# Patient Record
Sex: Male | Born: 1990 | Race: White | Hispanic: No | Marital: Single | Smoking: Former smoker
Health system: Southern US, Community
[De-identification: ages and names within clinical notes are randomized; demographics above are authoritative.]

## PROBLEM LIST (undated history)

## (undated) ENCOUNTER — Encounter

## (undated) ENCOUNTER — Ambulatory Visit: Payer: BLUE CROSS/BLUE SHIELD

## (undated) ENCOUNTER — Ambulatory Visit: Payer: PRIVATE HEALTH INSURANCE | Attending: Internal Medicine | Primary: Internal Medicine

## (undated) ENCOUNTER — Encounter: Attending: Infectious Disease | Primary: Infectious Disease

## (undated) ENCOUNTER — Encounter
Attending: Student in an Organized Health Care Education/Training Program | Primary: Student in an Organized Health Care Education/Training Program

## (undated) ENCOUNTER — Encounter
Payer: PRIVATE HEALTH INSURANCE | Attending: Physical Medicine & Rehabilitation | Primary: Physical Medicine & Rehabilitation

## (undated) ENCOUNTER — Telehealth

## (undated) ENCOUNTER — Encounter: Attending: Clinical | Primary: Clinical

## (undated) ENCOUNTER — Ambulatory Visit

## (undated) ENCOUNTER — Ambulatory Visit: Payer: PRIVATE HEALTH INSURANCE

## (undated) ENCOUNTER — Ambulatory Visit: Attending: Orthopaedic Surgery | Primary: Orthopaedic Surgery

## (undated) ENCOUNTER — Encounter: Payer: PRIVATE HEALTH INSURANCE | Attending: Orthopaedic Surgery | Primary: Orthopaedic Surgery

## (undated) ENCOUNTER — Encounter
Payer: PRIVATE HEALTH INSURANCE | Attending: Rehabilitative and Restorative Service Providers" | Primary: Rehabilitative and Restorative Service Providers"

## (undated) ENCOUNTER — Encounter: Payer: PRIVATE HEALTH INSURANCE | Attending: Ophthalmology | Primary: Ophthalmology

## (undated) ENCOUNTER — Encounter: Attending: Pediatrics | Primary: Pediatrics

## (undated) ENCOUNTER — Encounter: Payer: PRIVATE HEALTH INSURANCE | Attending: Otolaryngology | Primary: Otolaryngology

## (undated) ENCOUNTER — Encounter: Attending: Sports Medicine | Primary: Sports Medicine

## (undated) DIAGNOSIS — L409 Psoriasis, unspecified: Secondary | ICD-10-CM

## (undated) HISTORY — PX: TONSILLECTOMY: SUR1361

---
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
## 2016-08-17 ENCOUNTER — Other Ambulatory Visit: Payer: Self-pay | Admitting: Otolaryngology

## 2016-08-17 DIAGNOSIS — H9210 Otorrhea, unspecified ear: Secondary | ICD-10-CM

## 2016-08-17 DIAGNOSIS — H900 Conductive hearing loss, bilateral: Secondary | ICD-10-CM

## 2016-08-24 ENCOUNTER — Ambulatory Visit: Payer: Self-pay

## 2016-08-25 ENCOUNTER — Ambulatory Visit
Admission: RE | Admit: 2016-08-25 | Discharge: 2016-08-25 | Disposition: A | Discharge status: Home / Self Care | Payer: Managed Care, Other (non HMO) | Source: Ambulatory Visit | Attending: Otolaryngology | Admitting: Otolaryngology

## 2016-08-25 DIAGNOSIS — H9211 Otorrhea, right ear: Secondary | ICD-10-CM | POA: Diagnosis not present

## 2016-08-25 DIAGNOSIS — H900 Conductive hearing loss, bilateral: Secondary | ICD-10-CM | POA: Diagnosis present

## 2016-08-25 DIAGNOSIS — H9210 Otorrhea, unspecified ear: Secondary | ICD-10-CM

## 2016-09-07 ENCOUNTER — Encounter
Admission: RE | Admit: 2016-09-07 | Discharge: 2016-09-07 | Disposition: A | Discharge status: Home / Self Care | Payer: Managed Care, Other (non HMO) | Source: Ambulatory Visit | Attending: Otolaryngology | Admitting: Otolaryngology

## 2016-09-07 HISTORY — DX: Psoriasis, unspecified: L40.9

## 2016-09-07 NOTE — Patient Instructions (Signed)
  Your procedure is scheduled on: 09-15-16  Report to Same Day Surgery 2nd floor medical mall [facility](Medical Mall Entrance-take elevator on left to 2nd floor.  Check in with surgery information desk.) To find out your arrival time please call [phone] between 1PM - 3PM on 09-14-16  Remember: Instructions that are not followed completely may result in serious medical risk, up to and including death, or upon the discretion of your surgeon and anesthesiologist your surgery may need to be rescheduled.    _x___ 1. Do not eat food or drink liquids after midnight. No gum chewing or   hard candies.     __x__ 2. No Alcohol for 24 hours before or after surgery.   __x__3. No Smoking for 24 prior to surgery.   ____  4. Bring all medications with you on the day of surgery if instructed.    __x__ 5. Notify your doctor if there is any change in your medical condition     (cold, fever, infections).     Do not wear jewelry, make-up, hairpins, clips or nail polish.  Do not wear lotions, powders, or perfumes. You may wear deodorant.  Do not shave 48 hours prior to surgery. Men may shave face and neck.  Do not bring valuables to the hospital.    [facility] is not responsible for any belongings or valuables.               Contacts, dentures or bridgework may not be worn into surgery.  Leave your suitcase in the car. After surgery it may be brought to your room.  For patients admitted to the hospital, discharge time is determined by your                       treatment team.   Patients discharged the day of surgery will not be allowed to drive home.  You will need someone to drive you home and stay with you the night of your procedure.    Please read over the following fact sheets that you were given:   [facility] Preparing for [facility] and or MRSA Information   ____ Take anti-hypertensive (unless it includes a diuretic), cardiac, seizure, asthma,     anti-reflux and psychiatric medicines. These  include:  1. NONE  2.  3.  4.  5.  6.  ____Fleets enema or Magnesium Citrate as directed.   ____ Use CHG Soap or sage wipes as directed on instruction sheet   ____ Use inhalers on the day of surgery and bring to hospital day of surgery  ____ Stop Metformin and Janumet 2 days prior to surgery.    ____ Take 1/2 of usual insulin dose the night before surgery and none on the morning     surgery.   ____ Follow recommendations from Cardiologist, Pulmonologist or PCP regarding          stopping Aspirin, Coumadin, Pllavix ,Eliquis, Effient, or Pradaxa, and Pletal.  X____Stop Anti-inflammatories such as Advil, Aleve, Ibuprofen, Motrin, Naproxen, Naprosyn, Goodies powders or aspirin products NOW-OK to take Tylenol    ____ Stop supplements until after surgery.     ____ Bring C-Pap to the hospital.

## 2016-09-15 ENCOUNTER — Encounter: Admission: RE | Payer: Self-pay | Source: Ambulatory Visit

## 2016-09-15 ENCOUNTER — Ambulatory Visit
Admission: RE | Admit: 2016-09-15 | Payer: Managed Care, Other (non HMO) | Source: Ambulatory Visit | Admitting: Otolaryngology

## 2016-09-15 SURGERY — TYMPANOPLASTY, WITH MASTOIDECTOMY
Anesthesia: Choice | Laterality: Right

## 2016-09-15 MED ORDER — OFLOXACIN 0.3 % OP SOLN
OPHTHALMIC | Status: AC
  Filled 2016-09-15: qty 5

## 2016-09-15 MED ORDER — LIDOCAINE-EPINEPHRINE (PF) 1 %-1:200000 IJ SOLN
INTRAMUSCULAR | Status: AC
  Filled 2016-09-15: qty 30

## 2016-09-15 MED ORDER — BACITRACIN ZINC 500 UNIT/GM EX OINT
TOPICAL_OINTMENT | CUTANEOUS | Status: AC
  Filled 2016-09-15: qty 0.9

## 2016-09-15 MED ORDER — FAMOTIDINE 20 MG PO TABS
ORAL_TABLET | ORAL | Status: AC
  Filled 2016-09-15: qty 1

## 2016-09-15 MED ORDER — EPINEPHRINE PF 1 MG/ML IJ SOLN
INTRAMUSCULAR | Status: AC
  Filled 2016-09-15: qty 1

## 2016-09-15 MED ORDER — GELATIN ABSORBABLE 12-7 MM EX MISC
CUTANEOUS | Status: AC
  Filled 2016-09-15: qty 1

## 2017-03-07 ENCOUNTER — Ambulatory Visit
Admission: RE | Admit: 2017-03-07 | Discharge: 2017-03-07 | Disposition: A | Discharge status: Home / Self Care | Payer: PRIVATE HEALTH INSURANCE | Attending: Otolaryngology | Admitting: Otolaryngology

## 2017-03-07 ENCOUNTER — Ambulatory Visit
Admission: RE | Admit: 2017-03-07 | Discharge: 2017-03-07 | Disposition: A | Discharge status: Home / Self Care | Payer: PRIVATE HEALTH INSURANCE

## 2017-03-07 ENCOUNTER — Ambulatory Visit
Admission: RE | Admit: 2017-03-07 | Discharge: 2017-03-07 | Disposition: A | Discharge status: Home / Self Care | Payer: MEDICAID | Attending: Audiologist | Admitting: Audiologist

## 2017-03-07 DIAGNOSIS — H9011 Conductive hearing loss, unilateral, right ear, with unrestricted hearing on the contralateral side: Principal | ICD-10-CM

## 2017-03-07 DIAGNOSIS — R42 Dizziness and giddiness: Secondary | ICD-10-CM

## 2017-03-07 DIAGNOSIS — H7101 Cholesteatoma of attic, right ear: Principal | ICD-10-CM

## 2017-03-14 ENCOUNTER — Ambulatory Visit
Admission: RE | Admit: 2017-03-14 | Discharge: 2017-03-14 | Disposition: A | Discharge status: Home / Self Care | Payer: PRIVATE HEALTH INSURANCE

## 2017-03-14 DIAGNOSIS — H7101 Cholesteatoma of attic, right ear: Principal | ICD-10-CM

## 2017-03-17 ENCOUNTER — Ambulatory Visit
Admission: RE | Admit: 2017-03-17 | Discharge: 2017-03-17 | Disposition: A | Discharge status: Home / Self Care | Payer: PRIVATE HEALTH INSURANCE | Attending: Otolaryngology | Admitting: Otolaryngology

## 2017-03-17 ENCOUNTER — Ambulatory Visit
Admission: RE | Admit: 2017-03-17 | Discharge: 2017-03-17 | Disposition: A | Discharge status: Home / Self Care | Payer: PRIVATE HEALTH INSURANCE | Attending: Student in an Organized Health Care Education/Training Program | Admitting: Student in an Organized Health Care Education/Training Program

## 2017-03-17 DIAGNOSIS — Z01818 Encounter for other preprocedural examination: Principal | ICD-10-CM

## 2017-03-17 DIAGNOSIS — H7190 Unspecified cholesteatoma, unspecified ear: Principal | ICD-10-CM

## 2017-04-01 ENCOUNTER — Ambulatory Visit
Admission: RE | Admit: 2017-04-01 | Discharge: 2017-04-01 | Disposition: A | Discharge status: Home / Self Care | Payer: PRIVATE HEALTH INSURANCE | Attending: Certified Registered" | Admitting: Certified Registered"

## 2017-04-01 ENCOUNTER — Ambulatory Visit
Admission: RE | Admit: 2017-04-01 | Discharge: 2017-04-01 | Disposition: A | Discharge status: Home / Self Care | Payer: PRIVATE HEALTH INSURANCE | Attending: Otolaryngology | Admitting: Otolaryngology

## 2017-04-01 ENCOUNTER — Ambulatory Visit
Admission: RE | Admit: 2017-04-01 | Discharge: 2017-04-01 | Disposition: A | Discharge status: Home / Self Care | Payer: MEDICAID | Attending: Certified Registered" | Admitting: Certified Registered"

## 2017-04-01 DIAGNOSIS — H9191 Unspecified hearing loss, right ear: Principal | ICD-10-CM

## 2017-04-01 MED ORDER — CIPROFLOXACIN 0.3 %-DEXAMETHASONE 0.1 % EAR DROPS,SUSPENSION
Freq: Two times a day (BID) | OTIC | 0 refills | 0.00000 days | Status: CP
Start: 2017-04-01 — End: 2017-05-06

## 2017-04-01 MED ORDER — OXYCODONE 5 MG TABLET
ORAL_TABLET | ORAL | 0 refills | 0 days | Status: CP | PRN
Start: 2017-04-01 — End: 2017-04-06

## 2017-04-01 MED ORDER — AMOXICILLIN 875 MG-POTASSIUM CLAVULANATE 125 MG TABLET
ORAL_TABLET | Freq: Two times a day (BID) | ORAL | 0 refills | 0 days | Status: CP
Start: 2017-04-01 — End: 2017-04-08

## 2017-05-05 ENCOUNTER — Ambulatory Visit: Admit: 2017-05-05 | Discharge: 2017-05-06 | Attending: Otolaryngology | Primary: Otolaryngology

## 2017-05-05 DIAGNOSIS — H9193 Unspecified hearing loss, bilateral: Principal | ICD-10-CM

## 2017-05-05 DIAGNOSIS — H7190 Unspecified cholesteatoma, unspecified ear: Secondary | ICD-10-CM

## 2017-07-21 ENCOUNTER — Encounter
Admit: 2017-07-21 | Discharge: 2017-07-21 | Payer: PRIVATE HEALTH INSURANCE | Attending: Otolaryngology | Primary: Otolaryngology

## 2017-07-21 ENCOUNTER — Encounter
Admit: 2017-07-21 | Discharge: 2017-07-21 | Payer: PRIVATE HEALTH INSURANCE | Attending: Audiologist | Primary: Audiologist

## 2017-07-21 DIAGNOSIS — H7191 Unspecified cholesteatoma, right ear: Secondary | ICD-10-CM

## 2017-07-21 DIAGNOSIS — H90A11 Conductive hearing loss, unilateral, right ear with restricted hearing on the contralateral side: Secondary | ICD-10-CM

## 2017-07-21 DIAGNOSIS — H9193 Unspecified hearing loss, bilateral: Principal | ICD-10-CM

## 2017-10-04 ENCOUNTER — Encounter: Admit: 2017-10-04 | Discharge: 2017-10-05 | Payer: PRIVATE HEALTH INSURANCE

## 2017-10-04 DIAGNOSIS — M545 Low back pain: Secondary | ICD-10-CM

## 2017-10-04 DIAGNOSIS — B360 Pityriasis versicolor: Principal | ICD-10-CM

## 2017-10-04 DIAGNOSIS — Z0000 Encounter for general adult medical examination without abnormal findings: Secondary | ICD-10-CM

## 2017-10-04 DIAGNOSIS — L980 Pyogenic granuloma: Secondary | ICD-10-CM

## 2017-10-04 MED ORDER — KETOCONAZOLE 2 % TOPICAL CREAM
Freq: Two times a day (BID) | TOPICAL | 3 refills | 0.00000 days | Status: CP
Start: 2017-10-04 — End: 2017-10-18

## 2017-10-12 ENCOUNTER — Encounter
Admit: 2017-10-12 | Discharge: 2017-10-12 | Payer: PRIVATE HEALTH INSURANCE | Attending: Otolaryngology | Primary: Otolaryngology

## 2017-10-12 ENCOUNTER — Ambulatory Visit
Admit: 2017-10-12 | Discharge: 2017-10-12 | Payer: PRIVATE HEALTH INSURANCE | Attending: Audiologist | Primary: Audiologist

## 2017-10-12 ENCOUNTER — Encounter: Admit: 2017-10-12 | Discharge: 2017-10-12 | Payer: PRIVATE HEALTH INSURANCE

## 2017-10-12 DIAGNOSIS — H7191 Unspecified cholesteatoma, right ear: Secondary | ICD-10-CM

## 2017-10-12 DIAGNOSIS — H9193 Unspecified hearing loss, bilateral: Principal | ICD-10-CM

## 2017-11-21 ENCOUNTER — Encounter: Admit: 2017-11-21 | Discharge: 2017-11-22 | Payer: PRIVATE HEALTH INSURANCE

## 2017-11-21 DIAGNOSIS — M7130 Other bursal cyst, unspecified site: Secondary | ICD-10-CM

## 2017-11-21 DIAGNOSIS — L980 Pyogenic granuloma: Principal | ICD-10-CM

## 2017-12-26 ENCOUNTER — Encounter
Admit: 2017-12-26 | Discharge: 2017-12-26 | Payer: PRIVATE HEALTH INSURANCE | Attending: Orthopaedic Surgery | Primary: Orthopaedic Surgery

## 2017-12-26 ENCOUNTER — Encounter: Admit: 2017-12-26 | Discharge: 2017-12-26 | Payer: PRIVATE HEALTH INSURANCE

## 2017-12-26 DIAGNOSIS — M7130 Other bursal cyst, unspecified site: Principal | ICD-10-CM

## 2018-01-10 ENCOUNTER — Encounter: Admit: 2018-01-10 | Discharge: 2018-01-11 | Payer: PRIVATE HEALTH INSURANCE | Attending: Family | Primary: Family

## 2018-01-10 DIAGNOSIS — M545 Low back pain: Principal | ICD-10-CM

## 2018-01-10 MED ORDER — PREDNISONE 10 MG TABLETS IN A DOSE PACK
PACK | 0 refills | 0 days | Status: CP
Start: 2018-01-10 — End: 2018-03-06

## 2018-01-11 ENCOUNTER — Encounter
Admit: 2018-01-11 | Discharge: 2018-01-12 | Payer: PRIVATE HEALTH INSURANCE | Attending: Rehabilitative and Restorative Service Providers" | Primary: Rehabilitative and Restorative Service Providers"

## 2018-01-11 DIAGNOSIS — M545 Low back pain: Principal | ICD-10-CM

## 2018-01-26 DIAGNOSIS — R2231 Localized swelling, mass and lump, right upper limb: Principal | ICD-10-CM

## 2018-01-27 ENCOUNTER — Encounter: Admit: 2018-01-27 | Discharge: 2018-01-27 | Payer: PRIVATE HEALTH INSURANCE

## 2018-01-27 DIAGNOSIS — R2231 Localized swelling, mass and lump, right upper limb: Principal | ICD-10-CM

## 2018-01-27 MED ORDER — HYDROCODONE 5 MG-ACETAMINOPHEN 325 MG TABLET T-HOME
ORAL_TABLET | Freq: Four times a day (QID) | ORAL | 0 refills | 0.00000 days | Status: CP | PRN
Start: 2018-01-27 — End: 2018-01-30

## 2018-02-09 ENCOUNTER — Encounter
Admit: 2018-02-09 | Discharge: 2018-02-10 | Payer: PRIVATE HEALTH INSURANCE | Attending: Orthopaedic Surgery | Primary: Orthopaedic Surgery

## 2018-02-09 DIAGNOSIS — R2231 Localized swelling, mass and lump, right upper limb: Principal | ICD-10-CM

## 2018-03-06 ENCOUNTER — Ambulatory Visit
Admit: 2018-03-06 | Discharge: 2018-03-07 | Payer: PRIVATE HEALTH INSURANCE | Attending: Otolaryngology | Primary: Otolaryngology

## 2018-03-06 DIAGNOSIS — H7191 Unspecified cholesteatoma, right ear: Principal | ICD-10-CM

## 2018-03-20 ENCOUNTER — Ambulatory Visit
Admit: 2018-03-20 | Discharge: 2018-03-21 | Payer: PRIVATE HEALTH INSURANCE | Attending: Community Health | Primary: Community Health

## 2018-03-20 DIAGNOSIS — J0100 Acute maxillary sinusitis, unspecified: Principal | ICD-10-CM

## 2018-03-20 IMAGING — CT CT TEMPORAL BONES W/O CM
1 series · 15 of 26 positions shown, 19 images · non-contrast
Comparison: None.

CLINICAL DATA: Conductive hearing loss.  Otorrhea.

EXAM:
CT TEMPORAL BONES WITHOUT CONTRAST
TECHNIQUE: Axial and coronal plane CT imaging of the petrous temporal bones was
performed with thin-collimation image reconstruction. No intravenous
contrast was administered. Multiplanar CT image reconstructions were
also generated.

[Series 3: ax soft · axial · 0.33mm/px · z∈[-52,-6]mm · 15 of 26 slices shown, 19 images]
[im 2/26  brain]
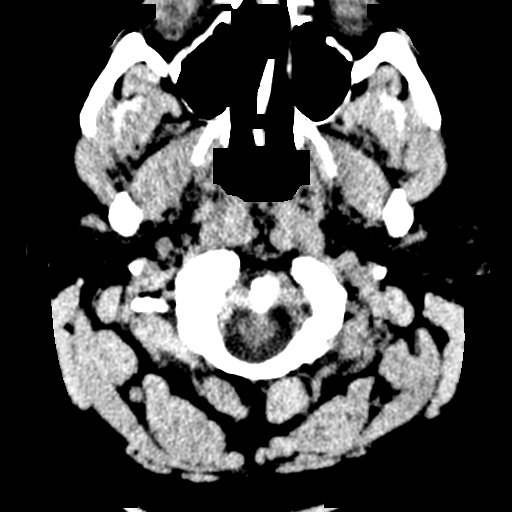
[im 2/26  bone]
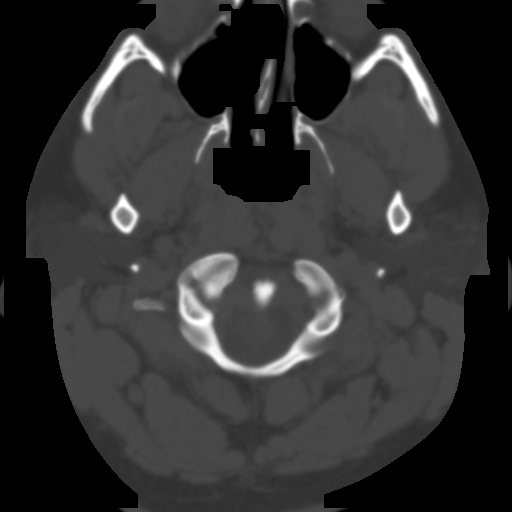
[im 4/26  bone]
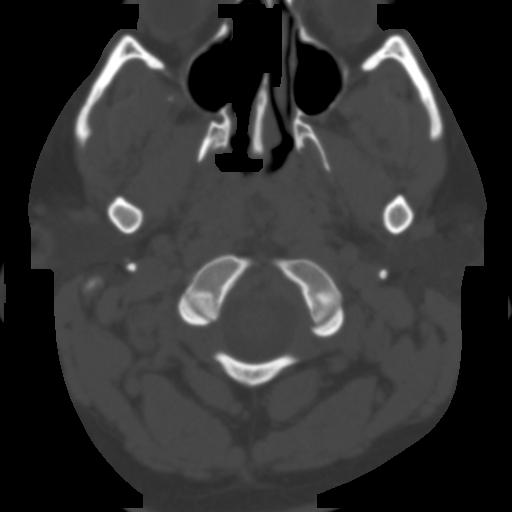
[im 6/26  bone]
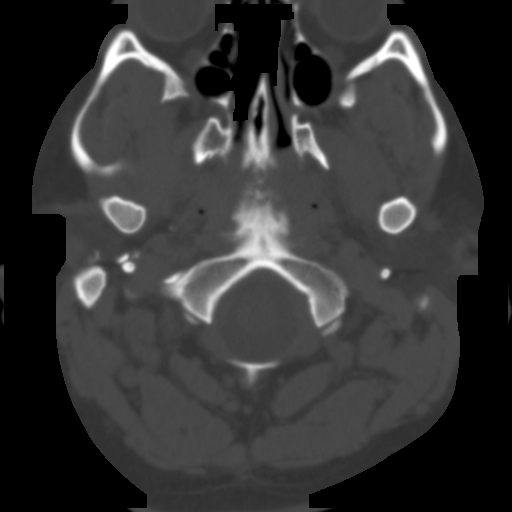
[im 7/26  bone]
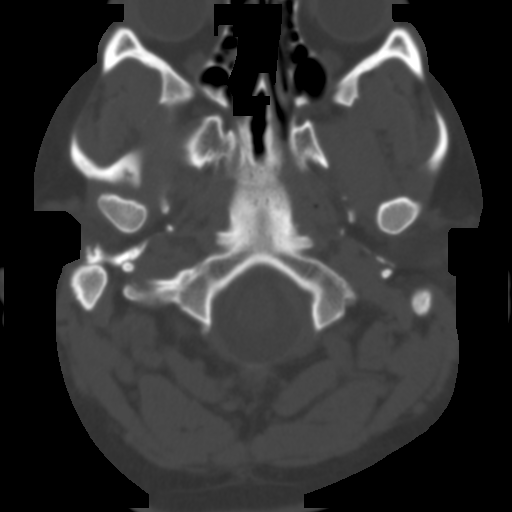
[im 9/26  brain]
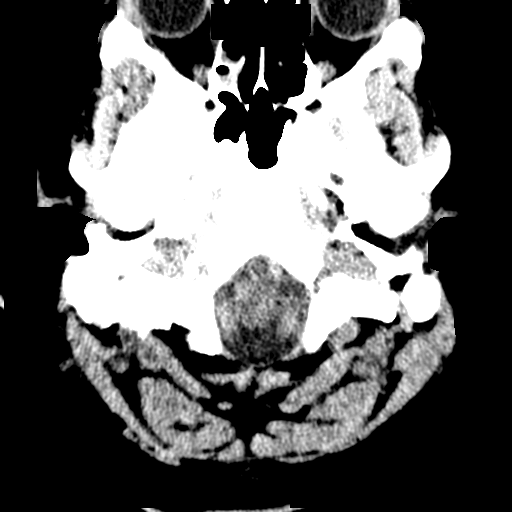
[im 9/26  bone]
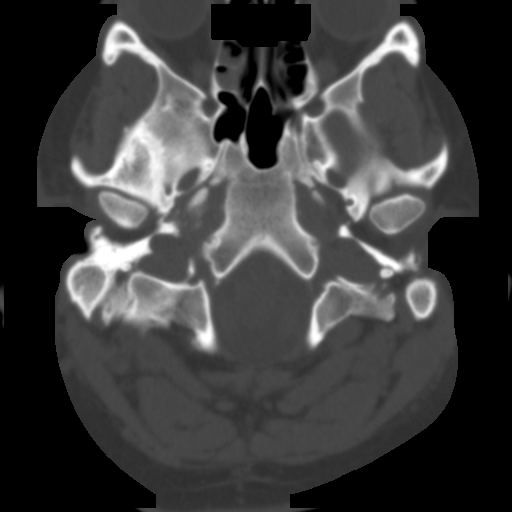
[im 10/26  bone]
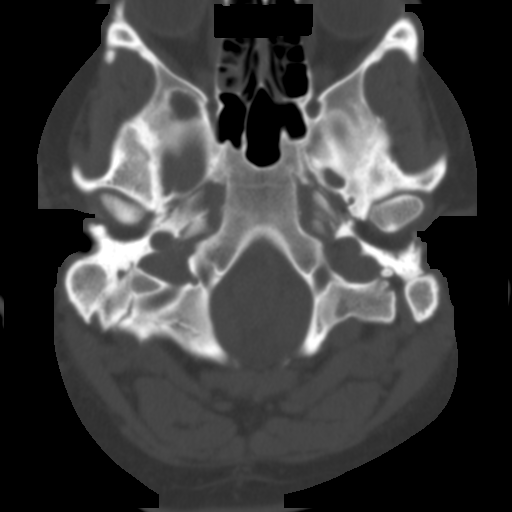
[im 12/26  bone]
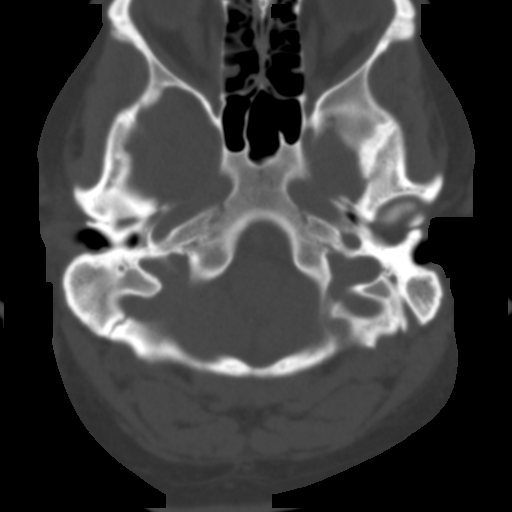
[im 14/26  bone]
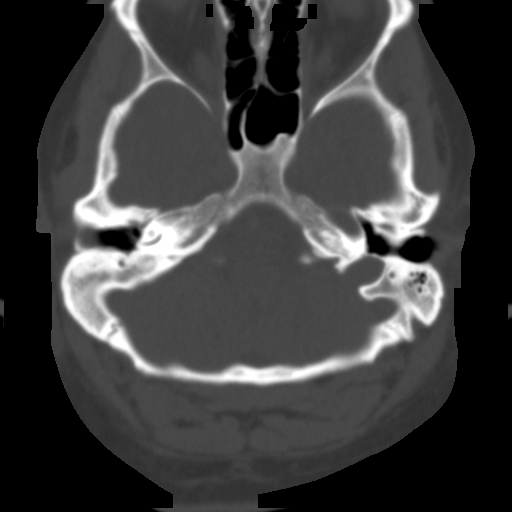
[im 15/26  brain]
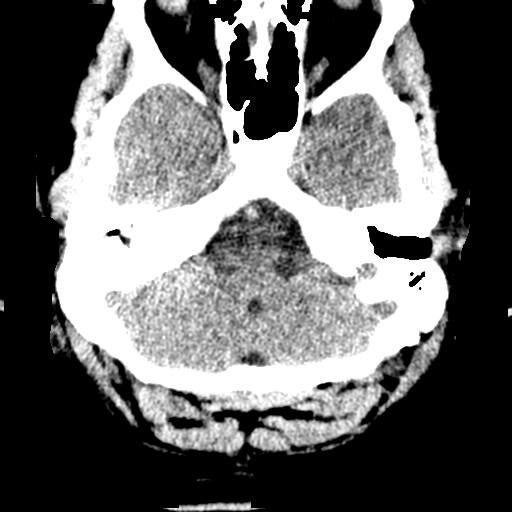
[im 15/26  bone]
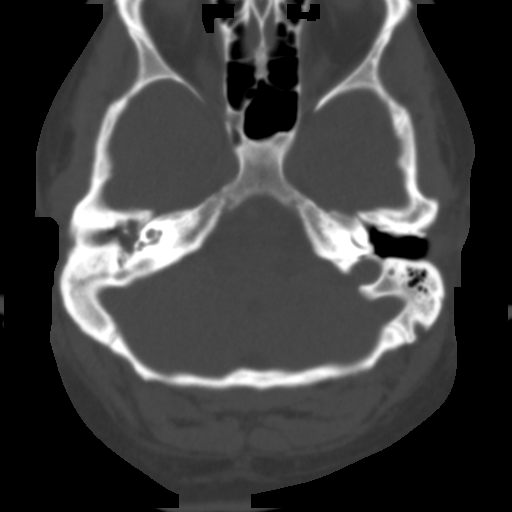
[im 17/26  bone]
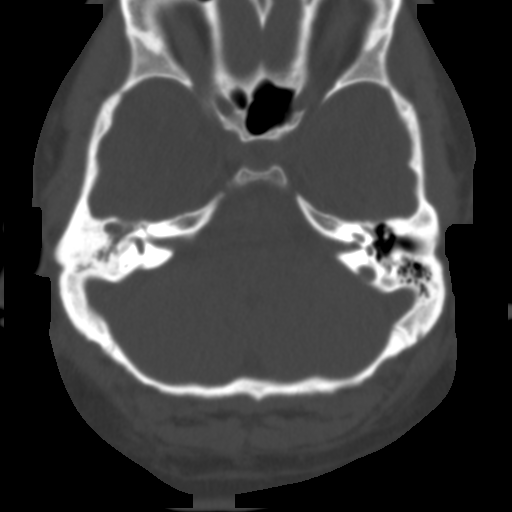
[im 18/26  bone]
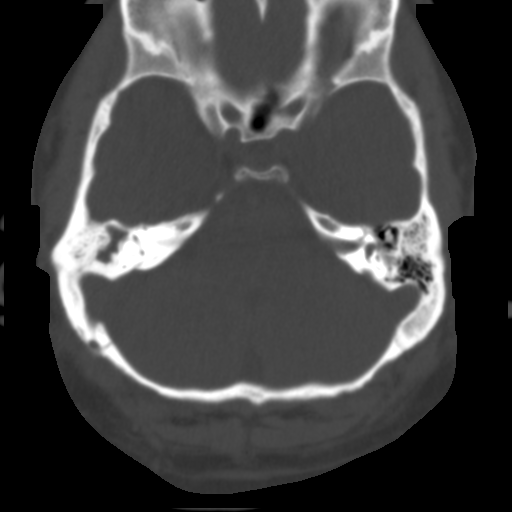
[im 20/26  bone]
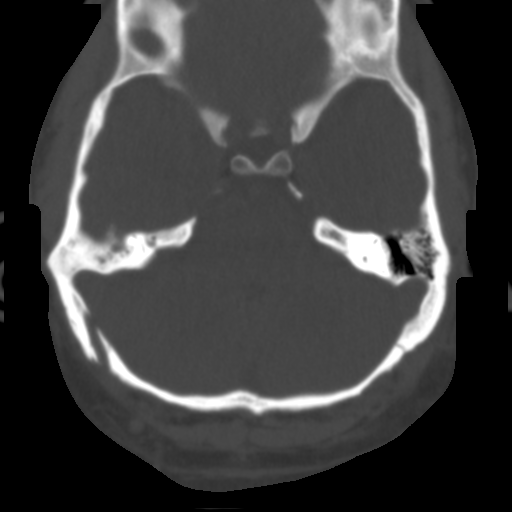
[im 22/26  brain]
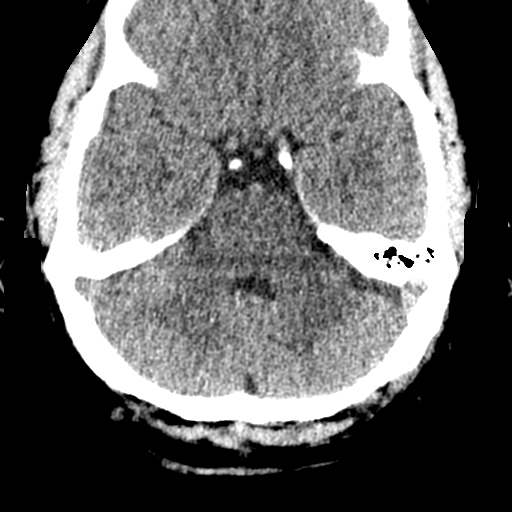
[im 22/26  bone]
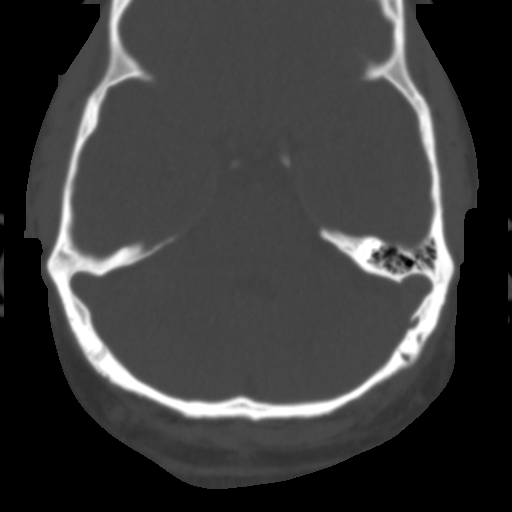
[im 23/26  bone]
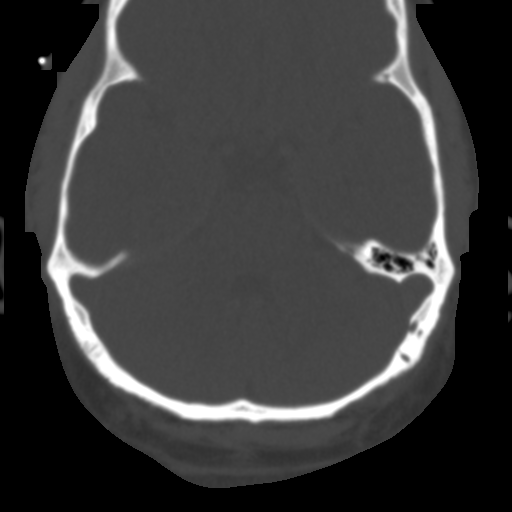
[im 25/26  bone]
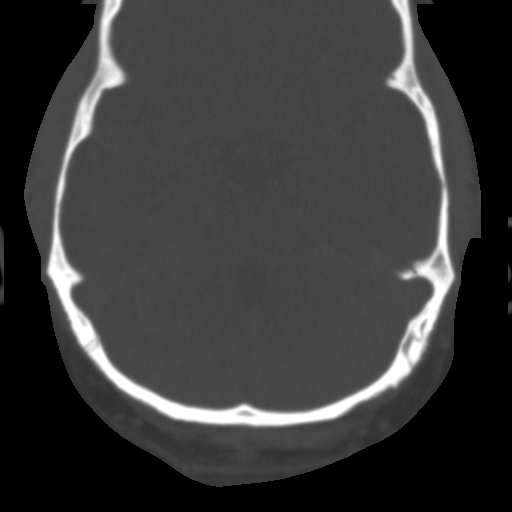

[15 of 26 positions shown; findings below may reference images not displayed]

FINDINGS: RIGHT: There is mild-to-moderate smooth soft tissue thickening
predominantly along the superior aspect of the external auditory
canal. The tympanic membrane is retracted. There is near complete
soft tissue opacification of the middle ear cavity with partial
erosion of the malleus and incus. The stapes is not well seen. There
may be slight erosion of the scutum. A complete bony covering is not
clearly identified surrounding the tympanic portion of the facial
nerve, with middle ear soft tissue extending to this region. There
is osseous thinning versus dehiscence of the tegmen tympani (series
6, image 72). The mastoid antrum is completely opacified. The
mastoid air cells overall appear sclerotic/underdeveloped. The
internal auditory canal, cochlea, vestibule, and semicircular canals
are unremarkable.

LEFT: The external auditory canal and tympanic membrane are
unremarkable. The ossicles appear intact. The tympanic cavity is
clear. The internal auditory canal, cochlea, vestibule, and
semicircular canals are unremarkable. The mastoid air cells are
clear.

There is mild right ethmoid air cell mucosal thickening. The
visualized portion of the brain is unremarkable. Visualized orbits
are unremarkable.
IMPRESSION: 1. Soft tissue throughout the right middle ear and mastoid antrum
with partial erosion of the ossicles, concerning for cholesteatoma.
Osseous thinning versus dehiscence of the tegmen tympani.
2. Unremarkable appearance of the left temporal bone.

## 2018-03-20 MED ORDER — AMOXICILLIN 875 MG-POTASSIUM CLAVULANATE 125 MG TABLET
ORAL_TABLET | Freq: Two times a day (BID) | ORAL | 0 refills | 0.00000 days | Status: CP
Start: 2018-03-20 — End: 2018-03-27

## 2018-04-04 ENCOUNTER — Encounter: Admit: 2018-04-04 | Discharge: 2018-04-05 | Payer: PRIVATE HEALTH INSURANCE

## 2018-04-04 DIAGNOSIS — J208 Acute bronchitis due to other specified organisms: Principal | ICD-10-CM

## 2018-11-27 ENCOUNTER — Encounter
Admit: 2018-11-27 | Discharge: 2018-11-28 | Payer: PRIVATE HEALTH INSURANCE | Attending: Otolaryngology | Primary: Otolaryngology

## 2018-11-27 DIAGNOSIS — H7292 Unspecified perforation of tympanic membrane, left ear: Principal | ICD-10-CM

## 2018-11-27 DIAGNOSIS — H7191 Unspecified cholesteatoma, right ear: Secondary | ICD-10-CM

## 2018-11-27 MED ORDER — A-C-H-S OTIC INSUFFLATE CAPS
ORAL_CAPSULE | OTIC | 0 refills | 0.00000 days | Status: CP
Start: 2018-11-27 — End: 2019-02-08

## 2018-11-27 MED FILL — SULFANILAMIDE (BULK) POWDER, CHLORAMPHENICOL (BULK) 100 % POWDER, A-C-H-S OTIC INSUFFLATE CAPS, HYDROCORTISONE (BULK) POWDER, CORN STARCH (BULK) POWDER: 73 days supply | Qty: 3 | Fill #0 | Status: AC

## 2018-11-27 MED FILL — SULFANILAMIDE (BULK) POWDER, CHLORAMPHENICOL (BULK) 100 % POWDER, A-C-H-S OTIC INSUFFLATE CAPS, HYDROCORTISONE (BULK) POWDER, CORN STARCH (BULK) POWDER: OTIC | 73 days supply | Qty: 3 | Fill #0

## 2019-04-23 ENCOUNTER — Encounter: Admit: 2019-04-23 | Discharge: 2019-04-24 | Payer: PRIVATE HEALTH INSURANCE

## 2019-04-23 DIAGNOSIS — Z23 Encounter for immunization: Principal | ICD-10-CM

## 2019-04-23 DIAGNOSIS — Z0000 Encounter for general adult medical examination without abnormal findings: Principal | ICD-10-CM

## 2019-05-24 ENCOUNTER — Encounter: Admit: 2019-05-24 | Discharge: 2019-05-25 | Payer: PRIVATE HEALTH INSURANCE

## 2019-06-01 ENCOUNTER — Encounter: Admit: 2019-06-01 | Discharge: 2019-06-01 | Payer: PRIVATE HEALTH INSURANCE

## 2019-06-01 DIAGNOSIS — H04123 Dry eye syndrome of bilateral lacrimal glands: Principal | ICD-10-CM

## 2019-06-22 ENCOUNTER — Ambulatory Visit: Admit: 2019-06-22 | Discharge: 2019-06-23 | Payer: PRIVATE HEALTH INSURANCE

## 2019-07-30 DIAGNOSIS — G4730 Sleep apnea, unspecified: Principal | ICD-10-CM

## 2019-11-08 ENCOUNTER — Encounter: Admit: 2019-11-08 | Discharge: 2019-11-08 | Payer: PRIVATE HEALTH INSURANCE

## 2019-11-08 DIAGNOSIS — H9193 Unspecified hearing loss, bilateral: Principal | ICD-10-CM

## 2019-12-05 ENCOUNTER — Encounter: Admit: 2019-12-05 | Discharge: 2019-12-07 | Payer: PRIVATE HEALTH INSURANCE

## 2019-12-12 DIAGNOSIS — G4739 Other sleep apnea: Principal | ICD-10-CM

## 2019-12-17 ENCOUNTER — Encounter: Admit: 2019-12-17 | Discharge: 2019-12-18 | Payer: PRIVATE HEALTH INSURANCE

## 2019-12-20 ENCOUNTER — Encounter: Admit: 2019-12-20 | Discharge: 2019-12-21 | Payer: PRIVATE HEALTH INSURANCE

## 2020-02-26 ENCOUNTER — Ambulatory Visit: Admit: 2020-02-26 | Discharge: 2020-02-27

## 2020-02-26 MED ORDER — LORAZEPAM 1 MG TABLET
ORAL_TABLET | 0 refills | 0 days | Status: CP
Start: 2020-02-26 — End: ?

## 2020-03-03 ENCOUNTER — Ambulatory Visit
Admit: 2020-03-03 | Discharge: 2020-03-04 | Payer: PRIVATE HEALTH INSURANCE | Attending: Physician Assistant | Primary: Physician Assistant

## 2020-03-03 DIAGNOSIS — Z20822 Contact with and (suspected) exposure to covid-19: Principal | ICD-10-CM

## 2020-03-12 ENCOUNTER — Ambulatory Visit
Admit: 2020-03-12 | Discharge: 2020-03-13 | Payer: PRIVATE HEALTH INSURANCE | Attending: Physician Assistant | Primary: Physician Assistant

## 2020-03-12 DIAGNOSIS — Z20828 Contact with and (suspected) exposure to other viral communicable diseases: Principal | ICD-10-CM

## 2020-03-12 DIAGNOSIS — Z20822 Contact with and (suspected) exposure to covid-19: Principal | ICD-10-CM

## 2020-03-13 ENCOUNTER — Ambulatory Visit
Admit: 2020-03-13 | Discharge: 2020-03-14 | Payer: PRIVATE HEALTH INSURANCE | Attending: Adult Health | Primary: Adult Health

## 2020-03-13 DIAGNOSIS — J069 Acute upper respiratory infection, unspecified: Principal | ICD-10-CM

## 2020-03-13 DIAGNOSIS — H6691 Otitis media, unspecified, right ear: Principal | ICD-10-CM

## 2020-03-13 DIAGNOSIS — H6091 Unspecified otitis externa, right ear: Principal | ICD-10-CM

## 2020-03-13 MED ORDER — AMOXICILLIN 875 MG-POTASSIUM CLAVULANATE 125 MG TABLET
ORAL_TABLET | Freq: Two times a day (BID) | ORAL | 0 refills | 7 days | Status: CP
Start: 2020-03-13 — End: 2020-03-20

## 2020-03-15 ENCOUNTER — Ambulatory Visit
Admit: 2020-03-15 | Discharge: 2020-03-16 | Payer: PRIVATE HEALTH INSURANCE | Attending: Physician Assistant | Primary: Physician Assistant

## 2020-03-15 DIAGNOSIS — Z20828 Contact with and (suspected) exposure to other viral communicable diseases: Principal | ICD-10-CM

## 2020-04-22 ENCOUNTER — Ambulatory Visit
Admit: 2020-04-22 | Discharge: 2020-04-23 | Payer: PRIVATE HEALTH INSURANCE | Attending: Physician Assistant | Primary: Physician Assistant

## 2020-04-22 DIAGNOSIS — Z20822 Contact with and (suspected) exposure to covid-19: Principal | ICD-10-CM

## 2020-04-22 DIAGNOSIS — Z20828 Contact with and (suspected) exposure to other viral communicable diseases: Principal | ICD-10-CM

## 2020-04-23 ENCOUNTER — Ambulatory Visit: Admit: 2020-04-23 | Discharge: 2020-04-24 | Payer: PRIVATE HEALTH INSURANCE

## 2020-04-23 DIAGNOSIS — Z20822 Suspected COVID-19 virus infection: Principal | ICD-10-CM

## 2020-04-23 DIAGNOSIS — J111 Influenza due to unidentified influenza virus with other respiratory manifestations: Principal | ICD-10-CM

## 2020-04-23 MED ORDER — OSELTAMIVIR 75 MG CAPSULE
ORAL_CAPSULE | Freq: Two times a day (BID) | ORAL | 0 refills | 5 days | Status: CP
Start: 2020-04-23 — End: 2020-04-28

## 2020-04-24 DIAGNOSIS — U071 COVID-19: Principal | ICD-10-CM

## 2020-04-28 ENCOUNTER — Ambulatory Visit: Admit: 2020-04-28 | Discharge: 2020-04-29 | Payer: PRIVATE HEALTH INSURANCE

## 2020-04-28 DIAGNOSIS — U071 COVID-19: Principal | ICD-10-CM

## 2020-05-20 ENCOUNTER — Ambulatory Visit
Admit: 2020-05-20 | Discharge: 2020-05-21 | Payer: PRIVATE HEALTH INSURANCE | Attending: Emergency Medicine | Primary: Emergency Medicine

## 2020-05-20 DIAGNOSIS — W19XXXA Unspecified fall, initial encounter: Principal | ICD-10-CM

## 2020-05-20 DIAGNOSIS — M545 Acute bilateral low back pain without sciatica: Principal | ICD-10-CM

## 2020-05-20 MED ORDER — CYCLOBENZAPRINE 10 MG TABLET
ORAL_TABLET | Freq: Every evening | ORAL | 0 refills | 10 days | Status: CP | PRN
Start: 2020-05-20 — End: ?

## 2021-03-19 ENCOUNTER — Encounter: Payer: Self-pay | Admitting: Emergency Medicine

## 2021-03-19 ENCOUNTER — Ambulatory Visit
Admission: EM | Admit: 2021-03-19 | Discharge: 2021-03-19 | Disposition: A | Discharge status: Home / Self Care | Payer: 59 | Attending: Internal Medicine | Admitting: Internal Medicine

## 2021-03-19 ENCOUNTER — Other Ambulatory Visit: Payer: Self-pay

## 2021-03-19 DIAGNOSIS — J069 Acute upper respiratory infection, unspecified: Secondary | ICD-10-CM | POA: Diagnosis not present

## 2021-03-19 MED ORDER — BENZONATATE 200 MG PO CAPS: 200.0000 mg | ORAL_CAPSULE | Freq: Three times a day (TID) | ORAL | 0 refills | Status: AC | PRN

## 2021-03-19 NOTE — ED Notes (Signed)
Called patient in from parking lot. 

## 2021-03-19 NOTE — ED Triage Notes (Signed)
Nasal congestion/runny nose and cough since Monday, denies fever

## 2021-03-19 NOTE — ED Provider Notes (Signed)
[facility]    CSN: [id number] Arrival date & time: 03/19/21  1405      History   Chief Complaint Chief Complaint  Patient presents with   Nasal Congestion   Cough    HPI Jordan Davis is a 30 y.o. male who presents with nose congestion, rhinitis and cough x 4 days. Denies having a fever. His wife suggested to get tested for RSV, but he is not aware of being exposed to anyone with that. Has had R ear surgery and always has some drainage, which is not any worse than usual and has no pain.  Had a negative covid test at home.    Past Medical History:  Diagnosis Date   Psoriasis    pt states he has not seen a dermatologist yet but is pretty sure it is Psoriasis-on his chest and under his armpit    There are no problems to display for this patient.   Past Surgical History:  Procedure Laterality Date   TONSILLECTOMY       Home Medications    Prior to Admission medications   Medication Sig Start Date End Date Taking? Authorizing Provider  benzonatate (TESSALON) 200 MG capsule Take 1 capsule (200 mg total) by mouth 3 (three) times daily as needed for cough. 03/19/21  Yes Rodriguez-Southworth, Nettie Elm, PA-C  acetaminophen (TYLENOL) 500 MG tablet Take 500-1,000 mg by mouth every 6 (six) hours as needed (for pain/headache).    [provider]    Family History History reviewed. No pertinent family history.  Social History Social History   Tobacco Use   Smoking status: Former    Packs/day: 1.00    Years: 3.00    Pack years: 3.00    Types: Cigarettes    Quit date: 02/08/2016    Years since quitting: 5.1   Smokeless tobacco: Never  Vaping Use   Vaping Use: Never used  Substance Use Topics   Alcohol use: Yes    Comment: rare   Drug use: No     Allergies   Patient has no known allergies.   Review of Systems Review of Systems  Constitutional:  Negative for appetite change, chills, diaphoresis, fatigue and fever.  HENT:  Positive for  congestion, ear discharge, postnasal drip and rhinorrhea. Negative for ear pain, sore throat and trouble swallowing.   Eyes:  Negative for discharge.  Respiratory:  Positive for cough. Negative for chest tightness and wheezing.   Skin:  Negative for rash.    Physical Exam Triage Vital Signs ED Triage Vitals  Enc Vitals Group     BP 03/19/21 1527 (!) 162/80     Pulse Rate 03/19/21 1527 81     Resp 03/19/21 1527 20     Temp 03/19/21 1527 98 F (36.7 C)     Temp Source 03/19/21 1527 Oral     SpO2 03/19/21 1527 99 %     Weight --      Height --      Head Circumference --      Peak Flow --      Pain Score 03/19/21 1529 0     Pain Loc --      Pain Edu? --      Excl. in GC? --    No data found.  Updated Vital Signs BP (!) 162/80 (BP Location: Right Arm)   Pulse 81   Temp 98 F (36.7 C) (Oral)   Resp 20   SpO2 99%   Visual Acuity Right  Eye Distance:   Left Eye Distance:   Bilateral Distance:    Right Eye Near:   Left Eye Near:    Bilateral Near:     Physical Exam Vitals and nursing note reviewed.  Constitutional:      General: He is not in acute distress.    Appearance: He is obese.  HENT:     Head: Normocephalic.     Left Ear: Tympanic membrane, ear canal and external ear normal.     Ears:     Comments: Has a lot of scarring     Nose: Congestion and rhinorrhea present.  Eyes:     General: No scleral icterus.    Conjunctiva/sclera: Conjunctivae normal.  Cardiovascular:     Rate and Rhythm: Normal rate and regular rhythm.     Heart sounds: No murmur heard. Pulmonary:     Effort: Pulmonary effort is normal.     Breath sounds: Normal breath sounds. No wheezing or rales.  Musculoskeletal:        General: Normal range of motion.     Cervical back: Neck supple.  Lymphadenopathy:     Cervical: No cervical adenopathy.  Skin:    General: Skin is warm and dry.     Findings: No rash.  Neurological:     Mental Status: He is alert and oriented to person, place,  and time.     Gait: Gait normal.  Psychiatric:        Mood and Affect: Mood normal.        Behavior: Behavior normal.        Thought Content: Thought content normal.        Judgment: Judgment normal.     UC Treatments / Results  Labs (all labs ordered are listed, but only abnormal results are displayed) Labs Reviewed - No data to display  EKG   Radiology No results found.  Procedures Procedures (including critical care time)  Medications Ordered in UC Medications - No data to display  Initial Impression / Assessment and Plan / UC Course  I have reviewed the triage vital signs and the nursing notes. URI Was explained we dont test adults for RSV I sent Tessalon for his cough as noted  Final Clinical Impressions(s) / UC Diagnoses   Final diagnoses:  Upper respiratory tract infection, unspecified type   Discharge Instructions   None    ED Prescriptions     Medication Sig Dispense Auth. Provider   benzonatate (TESSALON) 200 MG capsule Take 1 capsule (200 mg total) by mouth 3 (three) times daily as needed for cough. 30 capsule Rodriguez-Southworth, Nettie Elm, PA-C      PDMP not reviewed this encounter.   Garey Ham, PA-C 03/19/21 1616

## 2021-04-02 ENCOUNTER — Ambulatory Visit: Admit: 2021-04-02 | Discharge: 2021-04-02 | Payer: MEDICARE

## 2021-04-02 DIAGNOSIS — U071 COVID-19: Principal | ICD-10-CM

## 2021-09-14 DIAGNOSIS — G4739 Other sleep apnea: Principal | ICD-10-CM

## 2021-10-01 ENCOUNTER — Ambulatory Visit: Admit: 2021-10-01 | Discharge: 2021-10-01 | Payer: PRIVATE HEALTH INSURANCE

## 2021-10-01 ENCOUNTER — Institutional Professional Consult (permissible substitution)
Admit: 2021-10-01 | Discharge: 2021-10-01 | Payer: PRIVATE HEALTH INSURANCE | Attending: Audiologist | Primary: Audiologist

## 2021-10-01 DIAGNOSIS — H9193 Unspecified hearing loss, bilateral: Principal | ICD-10-CM

## 2021-11-24 DIAGNOSIS — D229 Melanocytic nevi, unspecified: Principal | ICD-10-CM

## 2021-12-10 ENCOUNTER — Ambulatory Visit: Admit: 2021-12-10 | Discharge: 2021-12-11 | Payer: PRIVATE HEALTH INSURANCE

## 2022-02-23 ENCOUNTER — Telehealth: Admit: 2022-02-23 | Discharge: 2022-02-24 | Payer: PRIVATE HEALTH INSURANCE

## 2022-02-23 DIAGNOSIS — K219 Gastro-esophageal reflux disease without esophagitis: Principal | ICD-10-CM

## 2022-02-23 DIAGNOSIS — Z6841 Body Mass Index (BMI) 40.0 and over, adult: Principal | ICD-10-CM

## 2022-02-23 MED ORDER — OMEPRAZOLE 20 MG CAPSULE,DELAYED RELEASE
ORAL_CAPSULE | Freq: Every day | ORAL | 1 refills | 60 days | Status: CP
Start: 2022-02-23 — End: 2023-02-23

## 2022-03-23 ENCOUNTER — Ambulatory Visit: Admit: 2022-03-23 | Discharge: 2022-03-24 | Payer: PRIVATE HEALTH INSURANCE

## 2022-03-23 DIAGNOSIS — Z6841 Body Mass Index (BMI) 40.0 and over, adult: Principal | ICD-10-CM

## 2022-04-02 ENCOUNTER — Ambulatory Visit: Admit: 2022-04-02 | Discharge: 2022-04-03 | Payer: PRIVATE HEALTH INSURANCE

## 2022-04-02 MED ORDER — NAPROXEN 500 MG TABLET
ORAL_TABLET | Freq: Two times a day (BID) | ORAL | 0 refills | 15 days | Status: CP
Start: 2022-04-02 — End: 2022-04-17

## 2022-04-04 MED ORDER — OZEMPIC 0.25 MG OR 0.5 MG (2 MG/3 ML) SUBCUTANEOUS PEN INJECTOR
SUBCUTANEOUS | 0 refills | 56 days | Status: CP
Start: 2022-04-04 — End: 2022-05-30

## 2022-04-08 ENCOUNTER — Ambulatory Visit: Admit: 2022-04-08 | Discharge: 2022-04-09 | Payer: PRIVATE HEALTH INSURANCE

## 2022-04-08 DIAGNOSIS — H908 Mixed conductive and sensorineural hearing loss, unspecified: Principal | ICD-10-CM

## 2022-04-15 DIAGNOSIS — Z713 Dietary counseling and surveillance: Principal | ICD-10-CM

## 2022-04-15 DIAGNOSIS — Z6841 Body Mass Index (BMI) 40.0 and over, adult: Principal | ICD-10-CM

## 2022-05-30 MED ORDER — OZEMPIC 1 MG/DOSE (4 MG/3 ML) SUBCUTANEOUS PEN INJECTOR
SUBCUTANEOUS | 0 refills | 28 days | Status: CP
Start: 2022-05-30 — End: 2022-06-21

## 2022-06-09 DIAGNOSIS — G4739 Other sleep apnea: Principal | ICD-10-CM

## 2022-06-09 DIAGNOSIS — Z6841 Body Mass Index (BMI) 40.0 and over, adult: Principal | ICD-10-CM

## 2022-06-09 DIAGNOSIS — Z713 Dietary counseling and surveillance: Principal | ICD-10-CM

## 2022-06-09 MED ORDER — ZEPBOUND 2.5 MG/0.5 ML SUBCUTANEOUS PEN INJECTOR
SUBCUTANEOUS | 0 refills | 0 days | Status: CP
Start: 2022-06-09 — End: 2022-06-09

## 2022-06-23 ENCOUNTER — Institutional Professional Consult (permissible substitution): Admit: 2022-06-23 | Discharge: 2022-06-24 | Payer: PRIVATE HEALTH INSURANCE

## 2022-06-24 DIAGNOSIS — K219 Gastro-esophageal reflux disease without esophagitis: Principal | ICD-10-CM

## 2022-06-24 MED ORDER — OMEPRAZOLE 20 MG CAPSULE,DELAYED RELEASE
ORAL_CAPSULE | Freq: Every day | ORAL | 1 refills | 60 days | Status: CP
Start: 2022-06-24 — End: ?

## 2022-06-27 MED ORDER — OZEMPIC 2 MG/DOSE (8 MG/3 ML) SUBCUTANEOUS PEN INJECTOR
SUBCUTANEOUS | 11 refills | 28 days | Status: CP
Start: 2022-06-27 — End: ?

## 2022-07-02 ENCOUNTER — Ambulatory Visit: Admit: 2022-07-02 | Discharge: 2022-07-03 | Payer: PRIVATE HEALTH INSURANCE

## 2022-07-02 ENCOUNTER — Institutional Professional Consult (permissible substitution): Admit: 2022-07-02 | Discharge: 2022-07-03 | Payer: PRIVATE HEALTH INSURANCE

## 2022-07-02 DIAGNOSIS — Z315 Encounter for genetic counseling: Principal | ICD-10-CM

## 2022-07-02 DIAGNOSIS — Z31440 Encounter of male for testing for genetic disease carrier status for procreative management: Principal | ICD-10-CM

## 2022-07-07 MED ORDER — ZEPBOUND 5 MG/0.5 ML SUBCUTANEOUS PEN INJECTOR
SUBCUTANEOUS | 0 refills | 0.00000 days | Status: CP
Start: 2022-07-07 — End: 2022-07-29
  Filled 2022-07-21: qty 2, 28d supply, fill #0

## 2022-07-14 ENCOUNTER — Ambulatory Visit: Admit: 2022-07-14 | Discharge: 2022-07-15 | Payer: PRIVATE HEALTH INSURANCE

## 2022-07-14 DIAGNOSIS — Z6841 Body Mass Index (BMI) 40.0 and over, adult: Principal | ICD-10-CM

## 2022-07-14 DIAGNOSIS — G4739 Other sleep apnea: Principal | ICD-10-CM

## 2022-07-14 DIAGNOSIS — Z713 Dietary counseling and surveillance: Principal | ICD-10-CM

## 2022-07-20 ENCOUNTER — Ambulatory Visit
Admit: 2022-07-20 | Discharge: 2022-07-21 | Payer: PRIVATE HEALTH INSURANCE | Attending: Student in an Organized Health Care Education/Training Program | Primary: Student in an Organized Health Care Education/Training Program

## 2022-07-20 DIAGNOSIS — J22 Unspecified acute lower respiratory infection: Principal | ICD-10-CM

## 2022-07-20 MED ORDER — AZITHROMYCIN 250 MG TABLET
ORAL_TABLET | 0 refills | 5 days | Status: CP
Start: 2022-07-20 — End: 2022-07-25

## 2022-08-04 MED ORDER — ZEPBOUND 7.5 MG/0.5 ML SUBCUTANEOUS PEN INJECTOR
SUBCUTANEOUS | 0 refills | 0.00000 days | Status: CP
Start: 2022-08-04 — End: 2022-06-09
  Filled 2022-08-18: qty 2, 28d supply, fill #0

## 2022-08-24 ENCOUNTER — Ambulatory Visit: Admit: 2022-08-24 | Discharge: 2022-08-25 | Payer: PRIVATE HEALTH INSURANCE

## 2022-09-14 ENCOUNTER — Ambulatory Visit
Admit: 2022-09-14 | Discharge: 2022-09-15 | Payer: PRIVATE HEALTH INSURANCE | Attending: Audiologist | Primary: Audiologist

## 2022-09-15 MED FILL — ZEPBOUND 7.5 MG/0.5 ML SUBCUTANEOUS PEN INJECTOR: SUBCUTANEOUS | 30 days supply | Qty: 2 | Fill #0

## 2022-09-21 DIAGNOSIS — Z6841 Body Mass Index (BMI) 40.0 and over, adult: Principal | ICD-10-CM

## 2022-09-21 DIAGNOSIS — Z713 Dietary counseling and surveillance: Principal | ICD-10-CM

## 2022-09-21 MED ORDER — SEMAGLUTIDE (WEIGHT LOSS) 1 MG/0.5 ML SUBCUTANEOUS PEN INJECTOR
SUBCUTANEOUS | 0 refills | 0 days | Status: CP
Start: 2022-09-21 — End: ?

## 2022-09-21 MED ORDER — SEMAGLUTIDE (WEIGHT LOSS) 0.5 MG/0.5 ML SUBCUTANEOUS PEN INJECTOR
SUBCUTANEOUS | 0 refills | 0 days | Status: CP
Start: 2022-09-21 — End: ?

## 2022-09-21 MED ORDER — SEMAGLUTIDE (WEIGHT LOSS) 2.4 MG/0.75 ML SUBCUTANEOUS PEN INJECTOR
SUBCUTANEOUS | 0 refills | 0 days | Status: CP
Start: 2022-09-21 — End: ?

## 2022-09-21 MED ORDER — SEMAGLUTIDE (WEIGHT LOSS) 1.7 MG/0.75 ML SUBCUTANEOUS PEN INJECTOR
SUBCUTANEOUS | 0 refills | 28 days | Status: CP
Start: 2022-09-21 — End: ?

## 2022-09-28 ENCOUNTER — Telehealth: Admit: 2022-09-28 | Discharge: 2022-09-29 | Payer: PRIVATE HEALTH INSURANCE

## 2022-09-28 DIAGNOSIS — Z713 Dietary counseling and surveillance: Principal | ICD-10-CM

## 2022-09-28 DIAGNOSIS — Z6841 Body Mass Index (BMI) 40.0 and over, adult: Principal | ICD-10-CM

## 2022-10-12 MED ORDER — ZEPBOUND 7.5 MG/0.5 ML SUBCUTANEOUS PEN INJECTOR
SUBCUTANEOUS | 0 refills | 30 days | Status: CP
Start: 2022-10-12 — End: 2022-11-03
  Filled 2022-10-13: qty 2, 28d supply, fill #0

## 2022-10-14 ENCOUNTER — Ambulatory Visit: Admit: 2022-10-14 | Payer: PRIVATE HEALTH INSURANCE

## 2022-11-08 DIAGNOSIS — Z713 Dietary counseling and surveillance: Principal | ICD-10-CM

## 2022-11-08 DIAGNOSIS — Z6841 Body Mass Index (BMI) 40.0 and over, adult: Principal | ICD-10-CM

## 2022-11-08 DIAGNOSIS — G4739 Other sleep apnea: Principal | ICD-10-CM

## 2022-11-08 MED ORDER — ZEPBOUND 7.5 MG/0.5 ML SUBCUTANEOUS PEN INJECTOR
SUBCUTANEOUS | 0 refills | 28 days | Status: CP
Start: 2022-11-08 — End: 2022-11-30

## 2022-11-11 NOTE — Unmapped (Signed)
[facility] Specialty Medication Onboarding    Specialty Medication: ZEPBOUND 7.5 mg/0.5 mL injection pen (tirzepatide)  Prior Authorization: Approved   Financial Assistance: No - copay  <$25  Final Copay/Day Supply: $24.99 / 28    Insurance Restrictions: None     Notes to Pharmacist: refill  Credit Card on File: no    The triage team has completed the benefits investigation and has determined that the patient is able to fill this medication at [facility]. Please contact the patient to complete the onboarding or follow up with the prescribing physician as needed.

## 2022-11-12 DIAGNOSIS — Z6841 Body Mass Index (BMI) 40.0 and over, adult: Principal | ICD-10-CM

## 2022-11-12 DIAGNOSIS — G4739 Other sleep apnea: Principal | ICD-10-CM

## 2022-11-12 DIAGNOSIS — Z713 Dietary counseling and surveillance: Principal | ICD-10-CM

## 2022-11-12 MED ORDER — ZEPBOUND 7.5 MG/0.5 ML SUBCUTANEOUS PEN INJECTOR
SUBCUTANEOUS | 0 refills | 28 days | Status: CP
Start: 2022-11-12 — End: 2022-12-04
  Filled 2022-11-11: qty 2, 28d supply, fill #0

## 2022-11-17 NOTE — Unmapped (Signed)
[facility] has reached out to this patient via MyChart to onboard them to our Specialty Lite services for their Zepbound. Their medication was last delivered on 11/12/22.They will now receive proactive outreach from the pharmacy team for refills.    Darryl Nestle, PharmD  [facility] Pharmacist

## 2022-12-07 DIAGNOSIS — Z6841 Body Mass Index (BMI) 40.0 and over, adult: Principal | ICD-10-CM

## 2022-12-07 DIAGNOSIS — Z713 Dietary counseling and surveillance: Principal | ICD-10-CM

## 2022-12-07 DIAGNOSIS — G4739 Other sleep apnea: Principal | ICD-10-CM

## 2022-12-07 MED ORDER — ZEPBOUND 7.5 MG/0.5 ML SUBCUTANEOUS PEN INJECTOR
SUBCUTANEOUS | 1 refills | 28 days | Status: CP
Start: 2022-12-07 — End: 2023-01-04
  Filled 2022-12-08: qty 2, 28d supply, fill #0

## 2022-12-07 NOTE — Unmapped (Signed)
[facility] Refill Coordination Note    Specialty Lite Medication(s) to be Shipped:   Zepbound    Other medication(s) to be shipped: No additional medications requested for fill at this time     Martin Delgado, DOB: 21-Jan-1991  Phone: [phone] (home)       All above HIPAA information was verified with patient's family member, Martin Delgado (spouse).     Was a Nurse, learning disability used for this call? No    Changes to medications:  no changes  Changes to insurance: No      REFERRAL TO PHARMACIST     Referral to the pharmacist: Not needed      [facility]     Shipping address confirmed in Epic.     Delivery Scheduled: Yes, Expected medication delivery date: 12/08/22.     Medication will be delivered via Same Day Courier to the prescription address in Epic WAM.    Roderic Palau, PharmD   [facility] Specialty Pharmacist

## 2022-12-28 ENCOUNTER — Ambulatory Visit: Admit: 2022-12-28 | Discharge: 2022-12-29 | Payer: PRIVATE HEALTH INSURANCE

## 2022-12-28 MED ORDER — KETOCONAZOLE 2 % TOPICAL CREAM
Freq: Every day | TOPICAL | 0 refills | 120 days | Status: CP
Start: 2022-12-28 — End: 2023-04-27

## 2022-12-28 NOTE — Unmapped (Unsigned)
ASSESSMENT/PLAN:    We had a good discussion with the patient regarding the problems listed below. The detailed plan is outlined below. Return/Emergent precautions were discussed. All questions were answered and patient agrees with the treatment plan moving forward.     Diagnoses and all orders for this visit:    Encounter for weight loss counseling  // Obesity:   - Today???s BP: 129/89 (BP AVG). Current BMI: (!) 40.35.  Wt Readings from Last 12 Encounters:   12/28/22 (!) 131.2 kg (289 lb 3.2 oz)   09/28/22 (!) 133.5 kg (294 lb 5 oz)   08/24/22 (!) 141.5 kg (312 lb)   07/20/22 (!) 144.2 kg (318 lb)   04/08/22 (!) 142.9 kg (315 lb)   04/02/22 (!) 144.4 kg (318 lb 6.4 oz)   03/23/22 (!) 145.1 kg (319 lb 12.8 oz)   12/10/21 (!) 142 kg (313 lb)   10/01/21 (!) 145.2 kg (320 lb)   04/02/21 (!) 136.1 kg (300 lb)   05/20/20 (!) 136.1 kg (300 lb)   04/23/20 (!) 136.1 kg (300 lb)      Lab Results   Component Value Date    Hemoglobin A1C 5.0 03/23/2022    Hemoglobin A1C 5.0 06/01/2019      Lab Results   Component Value Date    LDL 154 (H) 03/23/2022    AST 38 (H) 03/23/2022    ALT 73 (H) 03/23/2022      -You had a good discussion with the patient.  Patient is having positive weight loss with the medication.  He is also been optimizing his diet and exercise.  Patient believes his weight is starting to plateau.  As a result, will increase that down from 7.5 mg weekly to 2 mg weekly.  Will follow-up with the patient in 2 to 3 months.    RTC in 2-3 months to follow-up on weight loss      Michaelene Song, MD   [facility]  [facility]   [facility]   12/28/2022      Chief Complaint   Patient presents with    Follow-up     Patient is following-up on weight loss    Weight Loss        SUBJECTIVE:    Martin Delgado is a 32 y.o. male that presents to [facility]  today regarding the following issues:    Patient is presenting for a weight loss follow-up.   Patient is progressing well and has loss close to 29 pounds.   He is currently taking Zepbound.  He denies any side effect from the medication.  He is currently at 7.5 mg weekly.  He states that he felt his weight is plateaued over the last several weeks.  Patient is presenting to follow-up on weight and and possible dosage increase    I have reviewed the patients problem list, medical history, surgical history, laboratory history and recent hospitalizations, current medications, allergies, and social history and updated them as needed.    Review of symptoms:  Negative unless  Otherwise stated in HPI.     OBJECTIVE:    VITALS:   Vitals:    12/28/22 0837   BP: 129/89   Pulse: 85   Temp:     Wt:   Wt Readings from Last 3 Encounters:   12/28/22 (!) 131.2 kg (289 lb 3.2 oz)   09/28/22 (!) 133.5 kg (294 lb 5 oz)   08/24/22 Marland Kitchen)  141.5 kg (312 lb)       Physical Exam     Gen: Pleasant and cooperative in NAD, resting comfortably, appears stated age  Head: Normocephalic, atraumatic  EENT: PERRLA, EOMI, sclera clear  CV: Normal S1 S2 no m/r/g  Resp: Clear to ascultation bilaterally without adventitious sounds  GI: Soft, nontender,+BS, no palpable masses, no hepatosplenomegaly, no CVAT  Msk: No obvious deformities or swelling, strength 5/5 in BUE and BLE   Ext:  Pulses are palpable +2 in all four extremities, no swelling  Neuro:  A&O x 4, CN II-XII grossly intact, normal gait  Skin: Warm and dry, no obvious rash or suspicious lesions present      Imaging  Polysomnography (Standard)  [facility]                                                                                                      [facility]                                                                                                                  [address]  [place], [place], [address]  [phone]    Identification  Last Name: Delgado     First Name: Martin  Rec. Date/Time: 12/05/2019   MR#: [id number] MSLT: No   File Name: 1956     Date of Birth: May 23, 1990 Height: 5' 11   Age: 84 year Weight: 306.0 lbs   Sex: Male BMI: 42.8   Interpreting Phys.: Heidi L. Lucina Mellow, MD Fellow:    Referring Phys.: Madelon Lips ([id number]) Date Dictated: December 08, 2019     History   This is a SPLIT NIGHT polysomnogram and CPAP titration study. The patient   is a 32 year old, who is being evaluated for a sleep-related breathing   disorder.    Other Known Medical Issues: tympano matedectomy, obesity, GERDm psoriasis,   TMJ    Medications: None  Epworth Sleepiness Scale (total score) 8  Routine Bedtime:  9:30 PM   Routine Rise Time: 6 AM  Previous Night???s  Bedtime:  10:30 PM Rise Time: 5:30 AM  Typical Sleep  Daytime Activity   Caffeine Intake: 12 oz coffee @ 10 AM, 1 pack of M&Ms   Naps:    none      (total minutes)   Alcohol: none  Exercise:    none   (time of day)  Anything happen out of the ordinary: No  Do you need to be up by a certain time tomorrow?: 6 AM  How do you feel tonight?: Okay, Tired  Any nasal congestion?  No    Procedure   A multi-channel polysomnogram was recorded digitally and stored using a   [employer] polygraph. The input montage provided multiple recording   channels of central, temporal and occipital EEG, EOG, submental EMG, arm   and leg limb surface EMG, airflow from nasal pressure and nasal/oral   thermocouple, intercostal EMG, chest and abdominal movement via   respiratory impedance plethysmography belts, end tidal CO2 via a BCI   capnograph sampled through a nasal cannula, arterial blood oxygen via a   finger probe, and EKG via a modified Lead I. The polysomnograph was   reviewed in multiple montages. Time locked digital video was recorded with   the polysomnogram and selected segments reviewed. The study was scored   using the AASM 2020 guidelines for Hypopneas 3%/ arousals.    Study Results   The study started at 09:02:37 PM and ended at 05:45:35 AM. At the time of   initiation of the study the heart rate was 76 bpm, respiratory rate was 18 bpm, end tidal CO2 was 36 torr and the oxygen saturation was 95 %.   The total recording time was 523.0 minutes with a total sleep time of   466.5 minutes. The patient???s sleep latency was 12.6 minute(s) with 42.5   minute(s) of wake time recorded after sleep onset. The REM latency was   99.0 minutes. The sleep efficiency was 89.2%.   Total wake time during the night was 57.0 minute(s), which was 8.3% of   the total recording time. The sleep stage percentages are as follows:   Stage N1 = 3.2%, Stage N2 = 63.2%, Stage N3= 10.0%, and Stage R (REM   sleep) = 23.6%. The overall sleep architecture showed the majority of slow   wave sleep to be in the early part of the night and the majority of the   REM sleep to be in the latter part of the night.     Respiratory Pre-CPAP treatment   In the initial minutes of sleep time, which served as the diagnostic   portion of the study, the patient had a total recording time of 145.1   minutes and total sleep time of 120.5 minutes. Sleep efficiency was 83.0%.   Sleep architecture was as follows: Awake= 25.0 min. Stage N1 = 5.4%, Stage   N2 = 66.8%, Stage N3= 8.7% and Stage REM = 19.1%. There were a total of   159 respiratory event(s) for an Apnea Hypopnea Index (AHI) of 79.2 per   hour, and Apnea Index (AI) of 40.8 per hour. There were 82 obstructive   apnea(s), - mixed apnea(s), - central apnea(s) and 77 hypopneas. These   respiratory events were associated with arousals and oxygen desaturation   to a low of 71.0%. A total of - RERAs were noted for a RDI of 79.2. Cheyne   Stokes was not noted.     During the pre-treatment portion, the patient spent 84.0 minute(s) in   sleep supine position with a total of 109 events in NREM sleep in the   supine position and 35 events in REM sleep in the supine position. The   supine AHI is 102.9 event(s) per hour. The patient spent 36.5 minute(s) in   a side body position with 15 events in NREM sleep in the lateral decubitus   position and - events in REM sleep in the lateral decubitus position. The   lateral positional AHI is 24.7 event(s) per hour.  The patient spent 97.5 minutes in NREM sleep with 124 events during NREM   sleep. The NREM AHI is 76.3 event(s) per hour.   The patient spent 23.0 minute(s) in REM sleep, of which 23.0 minutes were   supine and - minutes were lateral. 35 event(s) occurred during REM sleep.   The REM AHI is 91.3, REM supine AHI is 91.3, and REM lateral AHI is -   event(s) per hour.      The average O2 saturation (SpO2) for the night was 94.9%. For 80.3% of   the night at saturation over 90% was monitored, for 17.0% of the night the   saturation ranged between 80% and 90% and 2.7% of the night was spent at a   saturation between 50% and 80%. The total time spent with O2 saturation   =<88% was 16.4 minutes.   The average End Tidal CO2 for the night was 41.0 torr, with a total of   0.0 minutes (0.0% of the night) spent at level of 50 torr or above.        Respiratory Post-CPAP treatment   Due to the degree of apnea noted after over the initial portion of the   study, the patient was given a CPAP titration trial for the latter part of   the study. For the post-treatment portion of the study, total recording   time of 377.9 minutes and total sleep time of 346.0 minutes. Sleep   efficiency was 91.6%. Sleep architecture was as follows: Awake= 32.0 min.   Stage N1 = 2.5%, Stage N2 = 62.0%, Stage N3= 10.4% and Stage REM = 25.1%.     The patient was titrated starting from a minimum CPAP pressure of 4*   cmH2O up to a maximum pressure of 17* cmH2O. The optimal CPAP pressure was   noted at CPAP 17, with an optimal AHI of 0 events/hour.  CPAP 16 was   tested in REM supine sleep with rare residual events.     For the CPAP portion of the night the patient spent 346.0 minute(s) in   the supine position with 25 events in NREM sleep in the supine position   and 2 events in REM sleep in the supine position. The supine AHI was 4.7.   The patient spent - minute(s) in a side body position with - events in   NREM sleep in the lateral decubitus position and - events in REM sleep in   the lateral decubitus position. The lateral decubitus AHI was -. The   patient spent - minute(s) in the prone position with - events in NREM   sleep in the prone position and - events in REM sleep in the prone   position. The prone AHI was -. The patient spent 259.0 minutes in NREM   sleep with 25 events during NREM sleep. The NREM AHI was 5.8. The patient   spent 87.0 minute(s) in REM sleep with 2 event(s) during REM sleep. The   REM AHI was 1.4. During the CPAP portion of the study there were a total   of 27 respiratory event(s) for an AHI of 4.7 per hour, and AI of 0.9 per   hour. There were 5 obstructive apnea(s), - mixed apnea(s), - central   apnea(s) and 22 hypopneas. A total of - RERAs were noted for a RDI of 4.7.   Cheyne Stokes was not noted.     The average O2 saturation (SpO2) for the night was 95.9%. For 99.8%  of   the night at saturation over 90% was monitored, for 0.2% of the night the   saturation ranged between 80% and 90% and 0.0% of the night was spent at a   saturation between 50% and 80%. The total time spent with O2 saturation   =<88% was 0.2 minutes.     The average End Tidal CO2 for the night was 0.0 torr, with a total of 0.0   minutes (0.0% of the night) spent at level of 50 torr or above.    Titration Summary:    PAP Level Recording Time (min) TST (min) REM (min) Supine REM (min) NREM   (min) Sleep Eff% OA# CA# MA# Hyp# AHI (/hr) RDI (/hr) Min OSat   Off 145.5 120.5 23.0  23.0 97.5 82.8% 82 - - 77 79.2 79.2 71.0   4 12.0 0.0 0.0  0.0 0.0 0.0% - - - - - - 94.0   5 6.5 5.0 0.0  0.0 5.0 76.9% - - - 2 24.0 24.0 94.0   6 2.0 2.0 0.0  0.0 2.0 100.0% 4 - - - 120.0 120.0 88.0   8 2.5 2.5 0.0  0.0 2.5 100.0% 1 - - 6 168.0 168.0 87.0   9 2.0 2.0 0.0  0.0 2.0 100.0% - - - 1 30.0 30.0 91.0   10 13.0 13.0 0.0  0.0 13.0 100.0% - - - - - - 94.0   11 13.0 13.0 0.0 0.0 13.0 100.0% - - - - - - 94.0   12 20.5 20.5 0.0  0.0 20.5 100.0% - - - 5 14.6 14.6 89.0   13 100.0 98.0 21.0  21.0 77.0 98.0% - - - 2 1.2 1.2 91.0   14 79.0 72.0 25.0  25.0 47.0 91.1% - - - 3 2.5 2.5 90.0   15 4.5 4.5 0.0  0.0 4.5 100.0% - - - - - - 94.0   16 75.5 75.5 41.0  41.0 34.5 100.0% - - - 3 2.4 2.4 90.0   17 47.5 38.0 0.0  0.0 38.0 80.0% - - - - - - 93.0      Limb Movements   There were a total of 0 periodic limb movement events with a PLM Index of   0,. There were 38 spontaneous arousal(s) noted with an index of 4.9   arousal(s) per hour of sleep.     Other Associated Events   The average heart rate in sleep was 70.1 with the average in REM sleep   68.1 and NREM 70.8. The peak heart rate in sleep was 107.0. The patient   had no events of gastroesophageal reflux, cardiac arrhythmias or   epileptiform activity on routine review of the study. The patient had no   events suggestive of parasomnia disorder and no events of bruxism were   noted.    Impression  In this PAP split night study:     1. Severe obstructive sleep apnea with AHI = 79.2 /hour - associated with   oxygen desaturations to a low of 71 %, arousals, and disruption of sleep   architecture. The total time spent with O2 saturation =<88% was 16.4   minutes.    2. CPAP 17 cm H20 best treated apnea and was associated with subjective   sleep benefit .  CPAP 16 was tested in supine sleep with near correction   of apnea. .     RECOMMENDATIONS:    1. CPAP 17 cm H20 with Heated humidity for  nasal dryness.  Mask Res Med   Quattro  Size medium, or patient preference.  The patient might get a   better seal with the mask after trimming facial hair.    2. The patient should be evaluated in follow up for CPAP compliance 31 to   90 days after initiation of CPAP therapy to ensure compliance of at least   4 hours per night for more than 70% of nights.    3. Adjunctive measures that can help respiration at night include weight   loss, maximal treatment of any nasal stuffiness if present, and avoidance   of the supine position. The patient could also be advised that respiratory   suppressant substances can aggravate apnea and should be avoided. These   include, but are not limited to, opioid analgesics, muscle relaxants, and   benzodiazepines.    ____________________________________  Threasa Heads, MD  Diplomate of Sleep Medicine, ABPN    Hypnograms          Michaelene Song, MD, CAQSM  [facility]  [facility]   [facility]   12/28/2022 [employer]   12/28/2022

## 2022-12-28 NOTE — Unmapped (Signed)
Thank you for entrusting [facility] with your care.  Please refer to the attached documents for more information.  If labs were obtained during this visit, please allow 48-72 hours for me to contact you with the results. Depending on the lab, processing time may vary. If you have any other questions or concerns, please not hesitate to contact us via MyChart or you can call the [facility] clinic at [phone].  Our Fax number is [phone]. Thanks again!     Best,   Michaelene Song, MD   [facility]  [facility]   [facility]

## 2022-12-31 NOTE — Unmapped (Signed)
Clinical Assessment Needed For: Dose Change  Medication: ZEPBOUND 10 mg/0.5 mL injection pen (tirzepatide)  Last Fill Date/Day Supply: 12/08/22 / 28  ZEPBOUND 7.5 mg/0.5 mL injection pen (tirzepatide)  [employer] $24.99  Was previous dose already scheduled to fill: No    Notes to Pharmacist: dose increase

## 2023-01-06 NOTE — Unmapped (Signed)
[facility] Refill Coordination Note    Specialty Lite Medication(s) to be Shipped:   Zepbound    Other medication(s) to be shipped: No additional medications requested for fill at this time     Martin Delgado, DOB: [birth year]  Phone: [phone] (home)       All above HIPAA information was verified with patient's family member, spouse.     Was a Nurse, learning disability used for this call? No    Changes to medications: Dayvin reports no changes at this time.  Changes to insurance: No      REFERRAL TO PHARMACIST     Referral to the pharmacist: Not needed      [facility]     Shipping address confirmed in Epic.     Delivery Scheduled: Yes, Expected medication delivery date: 01/07/2023.     Medication will be delivered via Same Day Courier to the prescription address in Epic WAM.    Kerby Less   [facility] Specialty Technician

## 2023-01-07 MED FILL — ZEPBOUND 7.5 MG/0.5 ML SUBCUTANEOUS PEN INJECTOR: SUBCUTANEOUS | 28 days supply | Qty: 2 | Fill #1

## 2023-02-07 NOTE — Unmapped (Signed)
[facility] Refill Coordination Note    Specialty Lite Medication(s) to be Shipped:   Zepbound    Other medication(s) to be shipped: No additional medications requested for fill at this time     Martin Delgado, DOB: 1990/09/06  Phone: [phone] (home)       All above HIPAA information was verified with patient's family member, wife.     Was a Nurse, learning disability used for this call? No    Changes to medications: [name] reports no changes at this time.  Changes to insurance: No      REFERRAL TO PHARMACIST     Referral to the pharmacist: Not needed      [facility]     Shipping address confirmed in Epic.     Delivery Scheduled: Yes, Expected medication delivery date: 10/10.     Medication will be delivered via Same Day Courier to the prescription address in Epic WAM.    [name]   [facility] Specialty Technician

## 2023-02-09 NOTE — Unmapped (Unsigned)
Martin Delgado is seen in follow up of chronic ear disease.     Dear Dr. Gershon Crane,    Thank you for referring Martin Delgado; as you know he is a 32 y.o. male who presents for follow-up.  Per chart review, patient with history significant for bilateral COM, right cholesteatoma.  He is s/p right CWD mastoidectomy 12/18, he had lateral canal fistula and skin draped over facial nerve/stapes footplate, no ossicular chain recon given the above findings. He previously had contralateral TM retraction on left without cholesteatoma, but that was his better hearing ear. Patient present again for a few episodes of unusual tinnitus - low pitched pulsing noise - three days in a row. He had none since. He did not feel like his hearing on the left had changed at all. He reported occasional drainage from the right side which he calls wax which has no smell to it.     Today, in clinic, the patient reports occasional episodes of dizziness, about 30 seconds in duration, that are triggered by strenuous exercise and laying down too quickly.  Otherwise, he notes that his right ear has been doing well since his procedure with Dr. Gershon Crane.  He does not use any ear drops.  He keeps this ear dry at all times.  No prior surgeries to his left side.     INTERVAL 10/01/21: The patient returns to clinic today for follow-up. He reports that he is doing well. He endorses intermittent right mastoid pain. He has occasional otorrhea. He denies any dizziness or vertigo in the interim. He endorses stable hearing. He has not used powder or drops recently.     INTERVAL 12/01/21: The patient returns for follow up. A few weeks ago, he reports a clogged sensation in his left ear. He then felt a whooshing sound that lasted for one day. Both of these sensations have now resolved. Occasionally, he feels that his voice is louder in his left ear. He has been using CASH powder in his right ear. No issues on that side.      INTERVAL 04/08/22: The patient returns to clinic today for follow-up. He reports that he is doing well. He is having minimal drainage from the right ear and hasn't needed to use CASH powder very often. He denies any otalgia. He is interested in a right-sided hearing aid.    INTERVAL 02/10/2023: The patient returns for a follow-up. He reports clear drainage from right ear about twice a week. He was initially using CASH powder daily, but he is not using it as much over the past month. He was fitted with right sided hearing aids about 8 to 9 months ago and he states that he is doing good with that.    Past Medical History  He  has a past medical history of Dizziness, GERD (gastroesophageal reflux disease), Headache, HL (hearing loss), Nosebleed, Otitis media, Psoriasis, Tinnitus, and TMJ dysfunction.    Past Surgical History  His  has a past surgical history that includes pr tympanoplas/mastoidec,radical/comple (Right, 04/01/2017); pr ear cartilage graft to face (Right, 04/01/2017); pr microsurg techniques,req oper microscope (N/A, 04/01/2017); pr removal of nail plate (Right, 1/61/0960); pr reconstruc of nail bed (Right, 01/27/2018); and pr exc tum/vasc mal sft tiss hand/fngr subq <1.5cm (Right, 01/27/2018).    Past Family History  His family history includes Diabetes in his maternal grandmother; Heart failure in his maternal grandmother; Hypertension in his maternal grandmother; Osteoarthritis in his maternal grandmother.    Past Social History  He  reports that he quit smoking about 3 years ago. His smoking use included cigarettes. He smoked 0.00 packs per day for 0.00 years. He has never used smokeless tobacco. He reports current alcohol use. He reports that he does not use drugs.    Medications/Allergies  His current medication(s) include:    Current Outpatient Medications:     ibuprofen (MOTRIN) 800 MG tablet, Take 800 mg by mouth Three (3) times a day., Disp: , Rfl:   Allergies: Patient has no known allergies.,    Review of systems   Review of systems was reviewed on attached notes/patient intake forms.     Physical Examination  There were no vitals taken for this visit.    General: well appearing, stated age, no distress   Communicates age appropriate, responds to speech well  Head - atraumatic, normocephalic   Face - no surface abnormalities, no tenderness over sinuses   Eyes - no conjunctivitis, no scleritis/iritis, EOM full   Nose - external nose without deformity, anterior rhinoscopy - turbinates, mucosa normal   Oral Cavity/Oropharynx/Lips:  Normal mucous membranes, normal floor of mouth/tongue/OP, no masses or lesions are noted.  Salivary Glands - no mass or asymmetry, no tenderness   Neck - no palpable masses/adenopathy, no thyromegaly   Lymphatic - no neck lymphadenopathy, no lymphedema   Cardiovascular - regular heart rate, no clubbing/cyanosis/edema in hands  Respiratory - breathing comfortably, no audible wheezing or stridor  Psychiatric- appropriate mood and affect  Neurologic - cranial nerves 2-12 intact  Facial Strength - HB 1/6 bilaterally     Ears - External ear- normal, no lesions, no malformations   Otoscopy - left EAC with impacted cerumen; after removal, EAC clear, TM clear and intact. Right CWD cavity with impacted cerumen; after removal, significant inflammatory and granulation tissue throughout the cavity, silver nitrate was applied to burn this tissue.    Procedure: cerumen removal left  Preop Dx: cerumen  Anesthesia: none  Description: cerumen removed from affected ear(s) using microinstruments and binocular microscopy    Procedure: complex mastoid debridement on right  Preop Dx: impacted debris in mastoid cavity  Anesthesia: none  Description: squamous debris removed from affected ear(s) using microinstruments and binocular microscopy    Audiogram:  I personally reviewed his audiogram (10/01/21) which revealed left SNHL and right mixed loss.      Tympanogram:  I personally reviewed his tympanogram which revealed large left ear canal volume.    Imaging  I personally reviewed and interpreted the patient's imaging studies.   CT Temporal Bones Wo Contrast 03/15/2017: Hypoplastic right middle ear and mastoid/sclerotic mastoid with soft tissue opacification of the superior mesotympanum and epitympanum and medial EAC/TM soft tissue thickening. There is partial erosion of the malleus head and incus, as well as dehiscence of the lateral semicircular canal and stapes erosion. These findings suggestive of cholesteatoma. Normal left temporal bone CT.  CT temporal bones WO contrast 08/25/16: Soft tissue throughout the right middle ear and mastoid antrum with partial erosion of the ossicles, concerning for cholesteatoma. Osseous thinning versus dehiscence of the tegmen tympani. Unremarkable appearance of the left temporal bone.    I reviewed outside medical records.    Assessment and Plan  Gloria Lambertson is a 32 y.o. male with bilateral COM/cholesteatoma s/p right CWD mastoidectomy on 04/2017. Patient doing well since last visit. There was debris in the right canal wall down cavity which was removed today. His right appear continues to be improved since beginning CASH powder. I agreed that  it is reasonable for him to use the powder as needed for his drainage. He had impacted cerumen in his left ear which was removed. Otherwise, his left ear appears stable with TM perforation and retraction onto the stapes with partial erosion of the incus. We again discussed options including observation vs surgical intervention via tympanoplasty with ossicular chain reconstruction. He would like to continue with observation for now. Today, on examination right CWD cavity with impacted cerumen; after removal, significant inflammatory and granulation tissue throughout the cavity, silver nitrate was applied to burn this tissue. Advised to use Ciprodex; cost effective alternate also discussed. If ear drops does not help, he can resume CASH powder. I will see him in follow-up in one year or sooner with issues.    I appreciate the opportunity to participate in his care.    Scribed for Dr. Tempie Hoist, MD, by Gillis Santa, medical scribe, on 02/10/2023 at 4:26 PM.    I, Dr. Tempie Hoist, MD have personally reviewed and agree with the information entered by the scribe.

## 2023-02-10 ENCOUNTER — Ambulatory Visit: Admit: 2023-02-10 | Discharge: 2023-02-11 | Payer: PRIVATE HEALTH INSURANCE

## 2023-02-10 MED ORDER — CIPROFLOXACIN 0.3 %-DEXAMETHASONE 0.1 % EAR DROPS,SUSPENSION
3 refills | 0 days | Status: CP
Start: 2023-02-10 — End: ?

## 2023-02-10 MED FILL — ZEPBOUND 10 MG/0.5 ML SUBCUTANEOUS PEN INJECTOR: SUBCUTANEOUS | 28 days supply | Qty: 2 | Fill #0

## 2023-02-21 DIAGNOSIS — Z148 Genetic carrier of other disease: Principal | ICD-10-CM

## 2023-02-22 ENCOUNTER — Ambulatory Visit: Admit: 2023-02-22 | Discharge: 2023-02-23 | Payer: PRIVATE HEALTH INSURANCE

## 2023-03-07 DIAGNOSIS — R638 Other symptoms and signs concerning food and fluid intake: Principal | ICD-10-CM

## 2023-03-07 DIAGNOSIS — Z713 Dietary counseling and surveillance: Principal | ICD-10-CM

## 2023-03-15 NOTE — Unmapped (Signed)
[facility] Refill Coordination Note    Specialty Lite Medication(s) to be Shipped:   Zepbound    Other medication(s) to be shipped: No additional medications requested for fill at this time     Martin Delgado, DOB: May 28, 1990  Phone: [phone] (home)       All above HIPAA information was verified with patient's family member, wife.     Was a Nurse, learning disability used for this call? No    Changes to medications: [name] reports no changes at this time.  Changes to insurance: No      REFERRAL TO PHARMACIST     Referral to the pharmacist: Not needed      [facility]     Shipping address confirmed in Epic.     Delivery Scheduled: Yes, Expected medication delivery date: 03/18/23 .  However, Rx request for refills was sent to the provider as there are none remaining.     Medication will be delivered via Same Day Courier to the prescription address in Epic WAM.    Ricci Barker   [facility] Pharm[facility] Specialty Technician

## 2023-03-18 MED FILL — ZEPBOUND 10 MG/0.5 ML SUBCUTANEOUS PEN INJECTOR: SUBCUTANEOUS | 28 days supply | Qty: 2 | Fill #0

## 2023-03-22 MED ORDER — ZEPBOUND 10 MG/0.5 ML SUBCUTANEOUS PEN INJECTOR
SUBCUTANEOUS | 0 refills | 0.00000 days | Status: CP
Start: 2023-03-22 — End: 2023-04-13

## 2023-05-02 DIAGNOSIS — R638 Other symptoms and signs concerning food and fluid intake: Principal | ICD-10-CM

## 2023-05-02 DIAGNOSIS — Z713 Dietary counseling and surveillance: Principal | ICD-10-CM

## 2023-05-02 MED ORDER — ZEPBOUND 10 MG/0.5 ML SUBCUTANEOUS PEN INJECTOR
SUBCUTANEOUS | 0 refills | 28.00 days
Start: 2023-05-02 — End: 2023-05-24

## 2023-05-03 MED ORDER — ZEPBOUND 10 MG/0.5 ML SUBCUTANEOUS PEN INJECTOR
SUBCUTANEOUS | 2 refills | 28.00 days | Status: CP
Start: 2023-05-03 — End: 2023-05-31
  Filled 2023-05-06: qty 2, 28d supply, fill #0

## 2023-05-03 NOTE — Unmapped (Signed)
[facility] Delivery Pharm[facility] Refill Coordination Note    Specialty Lite Medication(s) to be Shipped:   Zepbound    Other medication(s) to be shipped: No additional medications requested for fill at this time     Martin Delgado, DOB: [birth year]  Phone: [phone] (home)       All above HIPAA information was verified with patient's family member, wife.     Was a Nurse, learning disability used for this call? No    Changes to medications: Bauer reports no changes at this time.  Changes to insurance: No      REFERRAL TO PHARMACIST     Referral to the pharmacist: Not needed      [facility]     Shipping address confirmed in Epic.     Delivery Scheduled: Yes, Expected medication delivery date: 05/06/2023.     Medication will be delivered via Same Day Courier to the prescription address in Epic WAM.    Quintella Reichert   [facility] Pharm[facility] Specialty Technician

## 2023-10-21 DIAGNOSIS — L089 Local infection of the skin and subcutaneous tissue, unspecified: Principal | ICD-10-CM

## 2023-10-21 MED ORDER — MUPIROCIN 2 % TOPICAL OINTMENT
Freq: Two times a day (BID) | TOPICAL | 0 refills | 5.00000 days | Status: CP
Start: 2023-10-21 — End: 2023-10-26

## 2023-10-21 NOTE — Unmapped (Signed)
 Apply Mupirocin  twice daily in both nostrils for 5 days    Wash from neck down with Hibiclens daily for 1 week    Monitor lesion- if swelling, redness or pain worsens, get re-evaluated

## 2023-10-21 NOTE — Unmapped (Signed)
 Name:  Martin Delgado  DOB: 20-Aug-1990  Date: 10/21/2023    ASSESSMENT/PLAN:  There are no diagnoses linked to this encounter.  Assessment & Plan  Boil in pelvic area  Small draining boil likely from infected hair follicle. No systemic symptoms. Possible MRSA colonization due to wife's recent diagnosis.  - Apply antibiotic ointment in nostrils twice daily for five days.  - Use Hibiclens from neck down for one week.  - Avoid sharing towels and washcloths, especially with infant.  - Maintain regular cleaning practices.    Risk of MRSA colonization  Risk due to wife's recent MRSA diagnosis. Boils occur every six months, related to heat and activity. Current boil has low systemic infection risk.  - Apply antibiotic ointment in nostrils twice daily for five days.  - Use Hibiclens from neck down for one week.  - Consider repeating decolonization every six months to a year if recurrent.    ------------------------------------------------------------------------------    Chief Complaint   Patient presents with    Cyst     Boil like bump located on left side pelvic area near waist band - noticed yesterday, not itchy or painful unless touched.        HPI:      History of Present Illness  Martin Delgado is a 33 year old male who presents with a boil in the pelvic area.    He noticed the boil in his pelvic area yesterday. These occurrences are infrequent but tend to happen during hot weather, especially when he is engaged in outdoor work or workouts. He has not previously required antibiotics for similar boils, as they typically resolve on their own after a few days of applying topical treatments and maintaining cleanliness. The boil is sensitive to touch but does not cause pain otherwise.    He sought medical attention due to a recent incident where his wife had a similar boil that was lanced and tested positive for MRSA. He is concerned about the possibility of MRSA, given his wife's recent diagnosis. No fever, chills, or body aches, and the boil has not drained.    He experiences these boils approximately every six months, often in poorly aerated areas like the pubic region or back, particularly during hot weather when he is unable to shower immediately after sweating. He describes himself as an 'excessive sweater' and notes that he needs to shower more frequently during such times to prevent these occurrences.      ROS:  Review of systems as above.  Rest of review of systems negative unless otherwise noted as per HPI.    I have reviewed past medical, surgical, medications, allergies, social and family histories today and updated them in Epic where appropriate.    PMH:  Past Medical History[1]      MEDS:  Current Medications[2]    ALL:  Allergies[3]    SH:  Short Social History[4]      VITALS:  Vitals:    10/21/23 0924   BP: 134/84   Pulse: 81   Resp: 16   Temp: 36.7 ??C (98.1 ??F)   SpO2: 97%     Body mass index is 44.69 kg/m??.    PHYSICAL EXAM:  Physical Exam  SKIN: Infected hair follicle in the pelvic area with minimal pus and some blood.  Physical Exam  Constitutional:       Appearance: Normal appearance.     Skin:     Comments: 1 cm x 1 cm superficial abscess of left pubic area.  Draining a small amount of blood     Neurological:      Mental Status: He is alert.             TEST  RESULTS:    Results        SCREENINGS:      Follow-up as Needed  and Follow-up with PCP              Delon Stephane Shank, PA  [facility] [place]  ----------------------------------------------------------------  Note - This record has been created using [employer]. Chart creation errors have been sought, but may not always have been located. Such creation errors do not reflect on the standard of medical care.         [1]   Past Medical History:  Diagnosis Date    Dizziness     GERD (gastroesophageal reflux disease)     Headache     HL (hearing loss)     Nosebleed     Otitis media     Psoriasis     Tinnitus TMJ dysfunction    [2]   Current Outpatient Medications:     ciprofloxacin -dexAMETHasone  (CIPRODEX ) 0.3-0.1 % otic suspension, Administer 5  drops to affected ear/ears twice daily for 30 days., Disp: 7.5 mL, Rfl: 3    omeprazole  (PRILOSEC) 20 MG capsule, TAKE 1 CAPSULE BY MOUTH EVERY DAY, Disp: 60 capsule, Rfl: 1  [3] No Known Allergies  [4]   Social History  Tobacco Use    Smoking status: Former     Current packs/day: 0.00     Types: Cigarettes     Quit date: 03/07/2016     Years since quitting: 7.6     Passive exposure: Past    Smokeless tobacco: Never   Vaping Use    Vaping status: Never Used   Substance Use Topics    Alcohol use: Yes     Comment: socially    Drug use: No

## 2023-10-25 DIAGNOSIS — R1033 Periumbilical pain: Principal | ICD-10-CM

## 2023-10-25 NOTE — Unmapped (Signed)
 ASSESSMENT/PLAN:    We had a good discussion with the patient regarding the problems listed below. The detailed plan is outlined below. Return/Emergent precautions were discussed. All questions were answered and patient agrees with the treatment plan moving forward.     Diagnoses and all orders for this visit:    Periumbilical abdominal pain  -     CT Abdomen Pelvis W Contrast; Future      Assessment & Plan  Periumbilical abdominal pain  Acute sharp umbilical pain radiating to groin, exacerbated by increased intra-abdominal pressure. No visible bulging, but tenderness and slight induration present. CT scan preferred for evaluation for possible hernia..  - Order CT scan of abdomen to evaluate hernia  - Advise naproxen  as needed for analgesia and anti-inflammatory effects.  - Schedule imaging promptly to determine appropriate action.    RTC after imaging is complete    Rozelle Bennett, MD   [facility]  [facility] Medicine   [employer]  [place]   10/25/2023      Chief Complaint   Patient presents with    Abdominal Pain     Gut pain, above the navel and down to his groin area since last night-strenuous pain when urinating        SUBJECTIVE:    Martin Delgado is a 33 y.o. male that presents to [facility]  today regarding the following issues:    History of Present Illness  Martin Delgado is a 33 year old male who presents with acute abdominal pain radiating to the groin.    He experienced a sudden onset of sharp abdominal pain last night while leaning over his baby's crib. The pain originated from his navel and radiated to his groin, causing immobility for a couple of hours. At its peak, the pain was a 'ten out of ten'.    He has experienced similar, less severe discomfort in the abdominal area during strenuous activities like lifting weights. These episodes feel like a cramp around the navel, sometimes sensitive to touch, and resolve after a few days.    After the acute episode, he took naproxen , which allowed him to sit and urinate, though with abdominal tightness and strain. Urination was not painful. He continues to feel abdominal sensitivity, especially around the navel, with discomfort when bending or twisting.    There is no visible bulging in the abdominal or groin area, no testicular swelling, and no changes in scrotal size. He has no nausea, vomiting, diarrhea, or changes in bowel movements since the episode. He has not had a bowel movement since last night but feels no urgency.        I have reviewed the patients problem list, medical history, surgical history, laboratory history and recent hospitalizations, current medications, allergies, and social history and updated them as needed.    Review of symptoms:  Negative unless  Otherwise stated in HPI.     OBJECTIVE:    VITALS:   Vitals:    10/25/23 1110   BP: 111/75   Pulse: 69   Temp: 37.2 ??C (99 ??F)    Wt:   Wt Readings from Last 3 Encounters:   10/25/23 (!) 144.7 kg (319 lb)   10/21/23 (!) 145.3 kg (320 lb 6.4 oz)   12/28/22 (!) 131.2 kg (289 lb 3.2 oz)       Physical Exam     Gen: Pleasant and cooperative in NAD, resting comfortably, appears stated age  Head: Normocephalic, atraumatic  EENT: PERRLA, EOMI, sclera clear,   CV: Normal S1 S2 no m/r/g  Resp: Clear to ascultation bilaterally without adventitious sounds  GI: Soft, Periumbilical tenderness noted +BS, no palpable masses, no hepatosplenomegaly, no CVAT  Msk: No obvious deformities or swelling, strength 5/5 in BUE and BLE   Ext:  Pulses are palpable +2 in all four extremities, no swelling  Neuro:  A&O x 4, CN II-XII grossly intact, normal gait    I personally spent 25 minutes face-to-face and non-face-to-face in the care of this patient, which includes all pre, intra, and post visit time on the date of service.    Labs  @LABRESULTS @     Imaging  Polysomnography (Standard)  [facility]                                                                                                                  [address]  [place], [place], [address]  [phone]    Identification  Last Name: Delgado     First Name: Martin  Rec. Date/Time: 12/05/2019   MR#: [id number] MSLT: No   File Name: 1956     Date of Birth: 12-Aug-1990 Height: 5' 11   Age: 2 year Weight: 306.0 lbs   Sex: Male BMI: 42.8   Interpreting Phys.: Heidi L. Madelyn, MD Fellow:    Referring Phys.: EVALENE MOMENT ([id number]) Date Dictated: December 08, 2019     History   This is a SPLIT NIGHT polysomnogram and CPAP titration study. The patient   is a 33 year old, who is being evaluated for a sleep-related breathing   disorder.    Other Known Medical Issues: tympano matedectomy, obesity, GERDm psoriasis,   TMJ    Medications: None  Epworth Sleepiness Scale (total score) 8  Routine Bedtime:  9:30 PM   Routine Rise Time: 6 AM  Previous Night???s  Bedtime:  10:30 PM Rise Time: 5:30 AM  Typical Sleep  Daytime Activity   Caffeine Intake: 12 oz coffee @ 10 AM, 1 pack of M&Ms   Naps:    none      (total minutes)   Alcohol: none  Exercise:    none   (time of day)  Anything happen out of the ordinary: No  Do you need to be up by a certain time tomorrow?: 6 AM  How do you feel tonight?: Okay, Tired  Any nasal congestion?  No    Procedure   A multi-channel polysomnogram was recorded digitally and stored using a   [employer] polygraph. The input montage provided multiple recording   channels of central, temporal and occipital EEG, EOG, submental EMG, arm   and leg limb surface EMG, airflow from nasal pressure and nasal/oral   thermocouple, intercostal EMG, chest and abdominal movement via   respiratory impedance plethysmography belts, end tidal CO2 via a BCI   capnograph sampled through a nasal cannula, arterial blood oxygen via a   finger probe, and EKG via a modified Lead I.  The polysomnograph was reviewed in multiple montages. Time locked digital video was recorded with   the polysomnogram and selected segments reviewed. The study was scored   using the AASM 2020 guidelines for Hypopneas 3%/ arousals.    Study Results   The study started at 09:02:37 PM and ended at 05:45:35 AM. At the time of   initiation of the study the heart rate was 76 bpm, respiratory rate was 18   bpm, end tidal CO2 was 36 torr and the oxygen saturation was 95 %.   The total recording time was 523.0 minutes with a total sleep time of   466.5 minutes. The patient???s sleep latency was 12.6 minute(s) with 42.5   minute(s) of wake time recorded after sleep onset. The REM latency was   99.0 minutes. The sleep efficiency was 89.2%.   Total wake time during the night was 57.0 minute(s), which was 8.3% of   the total recording time. The sleep stage percentages are as follows:   Stage N1 = 3.2%, Stage N2 = 63.2%, Stage N3= 10.0%, and Stage R (REM   sleep) = 23.6%. The overall sleep architecture showed the majority of slow   wave sleep to be in the early part of the night and the majority of the   REM sleep to be in the latter part of the night.     Respiratory Pre-CPAP treatment   In the initial minutes of sleep time, which served as the diagnostic   portion of the study, the patient had a total recording time of 145.1   minutes and total sleep time of 120.5 minutes. Sleep efficiency was 83.0%.   Sleep architecture was as follows: Awake= 25.0 min. Stage N1 = 5.4%, Stage   N2 = 66.8%, Stage N3= 8.7% and Stage REM = 19.1%. There were a total of   159 respiratory event(s) for an Apnea Hypopnea Index (AHI) of 79.2 per   hour, and Apnea Index (AI) of 40.8 per hour. There were 82 obstructive   apnea(s), - mixed apnea(s), - central apnea(s) and 77 hypopneas. These   respiratory events were associated with arousals and oxygen desaturation   to a low of 71.0%. A total of - RERAs were noted for a RDI of 79.2. Cheyne   Stokes was not noted.     During the pre-treatment portion, the patient spent 84.0 minute(s) in   sleep supine position with a total of 109 events in NREM sleep in the   supine position and 35 events in REM sleep in the supine position. The   supine AHI is 102.9 event(s) per hour. The patient spent 36.5 minute(s) in   a side body position with 15 events in NREM sleep in the lateral decubitus   position and - events in REM sleep in the lateral decubitus position. The   lateral positional AHI is 24.7 event(s) per hour.    The patient spent 97.5 minutes in NREM sleep with 124 events during NREM   sleep. The NREM AHI is 76.3 event(s) per hour.   The patient spent 23.0 minute(s) in REM sleep, of which 23.0 minutes were   supine and - minutes were lateral. 35 event(s) occurred during REM sleep.   The REM AHI is 91.3, REM supine AHI is 91.3, and REM lateral AHI is -   event(s) per hour.      The average O2 saturation (SpO2) for the night was 94.9%. For 80.3% of   the night at saturation over 90%  was monitored, for 17.0% of the night the   saturation ranged between 80% and 90% and 2.7% of the night was spent at a   saturation between 50% and 80%. The total time spent with O2 saturation   =<88% was 16.4 minutes.   The average End Tidal CO2 for the night was 41.0 torr, with a total of   0.0 minutes (0.0% of the night) spent at level of 50 torr or above.        Respiratory Post-CPAP treatment   Due to the degree of apnea noted after over the initial portion of the   study, the patient was given a CPAP titration trial for the latter part of   the study. For the post-treatment portion of the study, total recording   time of 377.9 minutes and total sleep time of 346.0 minutes. Sleep   efficiency was 91.6%. Sleep architecture was as follows: Awake= 32.0 min.   Stage N1 = 2.5%, Stage N2 = 62.0%, Stage N3= 10.4% and Stage REM = 25.1%.     The patient was titrated starting from a minimum CPAP pressure of 4*   cmH2O up to a maximum pressure of 17* cmH2O. The optimal CPAP pressure was   noted at CPAP 17, with an optimal AHI of 0 events/hour.  CPAP 16 was   tested in REM supine sleep with rare residual events.     For the CPAP portion of the night the patient spent 346.0 minute(s) in   the supine position with 25 events in NREM sleep in the supine position   and 2 events in REM sleep in the supine position. The supine AHI was 4.7.   The patient spent - minute(s) in a side body position with - events in   NREM sleep in the lateral decubitus position and - events in REM sleep in   the lateral decubitus position. The lateral decubitus AHI was -. The   patient spent - minute(s) in the prone position with - events in NREM   sleep in the prone position and - events in REM sleep in the prone   position. The prone AHI was -. The patient spent 259.0 minutes in NREM   sleep with 25 events during NREM sleep. The NREM AHI was 5.8. The patient   spent 87.0 minute(s) in REM sleep with 2 event(s) during REM sleep. The   REM AHI was 1.4. During the CPAP portion of the study there were a total   of 27 respiratory event(s) for an AHI of 4.7 per hour, and AI of 0.9 per   hour. There were 5 obstructive apnea(s), - mixed apnea(s), - central   apnea(s) and 22 hypopneas. A total of - RERAs were noted for a RDI of 4.7.   Cheyne Stokes was not noted.     The average O2 saturation (SpO2) for the night was 95.9%. For 99.8% of   the night at saturation over 90% was monitored, for 0.2% of the night the   saturation ranged between 80% and 90% and 0.0% of the night was spent at a   saturation between 50% and 80%. The total time spent with O2 saturation   =<88% was 0.2 minutes.     The average End Tidal CO2 for the night was 0.0 torr, with a total of 0.0   minutes (0.0% of the night) spent at level of 50 torr or above.    Titration Summary:    PAP Level Recording Time (min)  TST (min) REM (min) Supine REM (min) NREM   (min) Sleep Eff% OA# CA# MA# Hyp# AHI (/hr) RDI (/hr) Min OSat   Off 145.5 120.5 23.0  23.0 97.5 82.8% 82 - - 77 79.2 79.2 71.0   4 12.0 0.0 0.0  0.0 0.0 0.0% - - - - - - 94.0   5 6.5 5.0 0.0  0.0 5.0 76.9% - - - 2 24.0 24.0 94.0   6 2.0 2.0 0.0  0.0 2.0 100.0% 4 - - - 120.0 120.0 88.0   8 2.5 2.5 0.0  0.0 2.5 100.0% 1 - - 6 168.0 168.0 87.0   9 2.0 2.0 0.0  0.0 2.0 100.0% - - - 1 30.0 30.0 91.0   10 13.0 13.0 0.0  0.0 13.0 100.0% - - - - - - 94.0   11 13.0 13.0 0.0  0.0 13.0 100.0% - - - - - - 94.0   12 20.5 20.5 0.0  0.0 20.5 100.0% - - - 5 14.6 14.6 89.0   13 100.0 98.0 21.0  21.0 77.0 98.0% - - - 2 1.2 1.2 91.0   14 79.0 72.0 25.0  25.0 47.0 91.1% - - - 3 2.5 2.5 90.0   15 4.5 4.5 0.0  0.0 4.5 100.0% - - - - - - 94.0   16 75.5 75.5 41.0  41.0 34.5 100.0% - - - 3 2.4 2.4 90.0   17 47.5 38.0 0.0  0.0 38.0 80.0% - - - - - - 93.0      Limb Movements   There were a total of 0 periodic limb movement events with a PLM Index of   0,. There were 38 spontaneous arousal(s) noted with an index of 4.9   arousal(s) per hour of sleep.     Other Associated Events   The average heart rate in sleep was 70.1 with the average in REM sleep   68.1 and NREM 70.8. The peak heart rate in sleep was 107.0. The patient   had no events of gastroesophageal reflux, cardiac arrhythmias or   epileptiform activity on routine review of the study. The patient had no   events suggestive of parasomnia disorder and no events of bruxism were   noted.    Impression  In this PAP split night study:     1. Severe obstructive sleep apnea with AHI = 79.2 /hour - associated with   oxygen desaturations to a low of 71 %, arousals, and disruption of sleep   architecture. The total time spent with O2 saturation =<88% was 16.4   minutes.    2. CPAP 17 cm H20 best treated apnea and was associated with subjective   sleep benefit .  CPAP 16 was tested in supine sleep with near correction   of apnea. .     RECOMMENDATIONS:    1. CPAP 17 cm H20 with Heated humidity for nasal dryness.  Mask Res Med   Quattro  Size medium, or patient preference.  The patient might get a   better seal with the mask after trimming facial hair.    2. The patient should be evaluated in follow up for CPAP compliance 31 to   90 days after initiation of CPAP therapy to ensure compliance of at least   4 hours per night for more than 70% of nights.    3. Adjunctive measures that can help respiration at night include weight   loss, maximal treatment of any nasal stuffiness if present,  and avoidance   of the supine position. The patient could also be advised that respiratory   suppressant substances can aggravate apnea and should be avoided. These   include, but are not limited to, opioid analgesics, muscle relaxants, and   benzodiazepines.    ____________________________________  Martin Delgado Mate, MD  Diplomate of Sleep Medicine, ABPN    Hypnograms          Rozelle Bennett, MD, [facility]  [facility]   [employer]  [place]   10/25/2023

## 2023-10-25 NOTE — Unmapped (Signed)
 Thank you for entrusting [facility] with your care.  Please refer to the attached documents for more information.  If labs were obtained during this visit, please allow 48-72 hours for me to contact you with the results. Depending on the lab, processing time may vary. If you have any other questions or concerns, please not hesitate to contact us  via MyChart or you can call the [facility] clinic at [phone].  Our Fax number is [phone]. Thanks again!     To schedule your Radiology/Imaging: Please call [phone]       Best,   Rozelle Bennett, MD   [facility] Family Medicine  [facility] Medicine   [employer]  [facility]

## 2023-11-01 ENCOUNTER — Inpatient Hospital Stay: Admit: 2023-11-01 | Discharge: 2023-11-01 | Payer: BLUE CROSS/BLUE SHIELD

## 2023-11-01 MED ADMIN — iohexol (OMNIPAQUE) 350 mg iodine/mL solution 100 mL: 100 mL | INTRAVENOUS | @ 17:00:00 | Stop: 2023-11-01

## 2023-11-02 DIAGNOSIS — K4090 Unilateral inguinal hernia, without obstruction or gangrene, not specified as recurrent: Principal | ICD-10-CM

## 2023-11-02 DIAGNOSIS — K429 Umbilical hernia without obstruction or gangrene: Principal | ICD-10-CM

## 2023-11-02 NOTE — Unmapped (Signed)
 Patient was called and results were reviewed.  CT imaging was concerning for a small umbilical hernia and left inguinal hernia.  Patient is symptomatic.  Symptoms have decreased but he still having umbilical pain that is tender to palpation with radiation to the groin region.  Different treatment options were discussed.  With shared decision making, patient will be referred to general surgery for further evaluation.  Referral has been placed and number will be provided.    Martin Bennett, MD, [facility]  [facility] Medicine   [employer]  [facility]

## 2023-11-22 DIAGNOSIS — R1033 Periumbilical pain: Principal | ICD-10-CM

## 2023-11-22 DIAGNOSIS — K429 Umbilical hernia without obstruction or gangrene: Principal | ICD-10-CM

## 2023-12-01 NOTE — Unmapped (Signed)
 Martin Delgado is seen in follow up of chronic ear disease.     Dear Dr. Cherilyn,    Thank you for referring Martin Delgado; as you know he is a 33 y.o. male who presents for follow-up.  Per chart review, patient with history significant for bilateral COM, right cholesteatoma.  He is s/p right CWD mastoidectomy 12/18, he had lateral canal fistula and skin draped over facial nerve/stapes footplate, no ossicular chain recon given the above findings. He previously had contralateral TM retraction on left without cholesteatoma, but that was his better hearing ear. Patient present again for a few episodes of unusual tinnitus - low pitched pulsing noise - three days in a row. He had none since. He did not feel like his hearing on the left had changed at all. He reported occasional drainage from the right side which he calls wax which has no smell to it.     Today, in clinic, the patient reports occasional episodes of dizziness, about 30 seconds in duration, that are triggered by strenuous exercise and laying down too quickly.  Otherwise, he notes that his right ear has been doing well since his procedure with Dr. Cherilyn.  He does not use any ear drops.  He keeps this ear dry at all times.  No prior surgeries to his left side.     INTERVAL 10/01/21: The patient returns to clinic today for follow-up. He reports that he is doing well. He endorses intermittent right mastoid pain. He has occasional otorrhea. He denies any dizziness or vertigo in the interim. He endorses stable hearing. He has not used powder or drops recently.     INTERVAL 12/01/21: The patient returns for follow up. A few weeks ago, he reports a clogged sensation in his left ear. He then felt a whooshing sound that lasted for one day. Both of these sensations have now resolved. Occasionally, he feels that his voice is louder in his left ear. He has been using CASH powder in his right ear. No issues on that side.      INTERVAL 04/08/22: The patient returns to clinic today for follow-up. He reports that he is doing well. He is having minimal drainage from the right ear and hasn't needed to use CASH powder very often. He denies any otalgia. He is interested in a right-sided hearing aid.    INTERVAL 02/10/2023: The patient returns for a follow-up. He reports clear drainage from right ear about twice a week. He was initially using CASH powder daily, but he is not using it as much over the past month. He was fitted with right sided hearing aids about 8 to 9 months ago and he states that he is doing good with that.    INTERVAL 12/08/2023: The patient returns for a follow-up. He reports that he is doing well. He is having minimal drainage from the right ear and has not needed to use CASH powder very often. He denied recent otalgia.       Past Medical History  He  has a past medical history of Dizziness, GERD (gastroesophageal reflux disease), Headache, HL (hearing loss), Nosebleed, Otitis media, Psoriasis, Tinnitus, and TMJ dysfunction.    Past Surgical History  His  has a past surgical history that includes pr tympanoplas/mastoidec,radical/comple (Right, 04/01/2017); pr ear cartilage graft to face (Right, 04/01/2017); pr microsurg techniques,req oper microscope (N/A, 04/01/2017); pr removal of nail plate (Right, 0/72/7980); pr reconstruc of nail bed (Right, 01/27/2018); and pr exc tum/vasc mal sft tiss hand/fngr  subq <1.5cm (Right, 01/27/2018).    Past Family History  His family history includes Diabetes in his maternal grandmother; Heart failure in his maternal grandmother; Hypertension in his maternal grandmother; Osteoarthritis in his maternal grandmother.    Past Social History  He  reports that he quit smoking about 3 years ago. His smoking use included cigarettes. He smoked 0.00 packs per day for 0.00 years. He has never used smokeless tobacco. He reports current alcohol use. He reports that he does not use drugs.    Medications/Allergies  His current medication(s) include:    Current Outpatient Medications:     ibuprofen (MOTRIN) 800 MG tablet, Take 800 mg by mouth Three (3) times a day., Disp: , Rfl:   Allergies: Patient has no known allergies.,    Review of systems   Review of systems was reviewed on attached notes/patient intake forms.     Physical Examination  There were no vitals taken for this visit.    General: well appearing, stated age, no distress   Communicates age appropriate, responds to speech well  Head - atraumatic, normocephalic   Face - no surface abnormalities, no tenderness over sinuses   Eyes - no conjunctivitis, no scleritis/iritis, EOM full   Nose - external nose without deformity, anterior rhinoscopy - turbinates, mucosa normal   Oral Cavity/Oropharynx/Lips:  Normal mucous membranes, normal floor of mouth/tongue/OP, no masses or lesions are noted.  Salivary Glands - no mass or asymmetry, no tenderness   Neck - no palpable masses/adenopathy, no thyromegaly   Lymphatic - no neck lymphadenopathy, no lymphedema   Cardiovascular - regular heart rate, no clubbing/cyanosis/edema in hands  Respiratory - breathing comfortably, no audible wheezing or stridor  Psychiatric- appropriate mood and affect  Neurologic - cranial nerves 2-12 intact  Facial Strength - HB 1/6 bilaterally     Ears - External ear- normal, no lesions, no malformations   Otoscopy - Left EAC with impacted cerumen; after removal, EAC clear, TM with deep retraction. Right CWD cavity with impacted cerumen; after removal, superior scar band noted.     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      Tympanogram:  I personally reviewed his tympanogram which revealed large left ear canal volume.    Imaging  I personally reviewed and interpreted the patient's imaging studies.   CT Temporal Bones Wo Contrast 03/15/2017: Hypoplastic right middle ear and mastoid/sclerotic mastoid with soft tissue opacification of the superior mesotympanum and epitympanum and medial EAC/TM soft tissue thickening. There is partial erosion of the malleus head and incus, as well as dehiscence of the lateral semicircular canal and stapes erosion. These findings suggestive of cholesteatoma. Normal left temporal bone CT.  CT temporal bones WO contrast 08/25/16: Soft tissue throughout the right middle ear and mastoid antrum with partial erosion of the ossicles, concerning for cholesteatoma. Osseous thinning versus dehiscence of the tegmen tympani. Unremarkable appearance of the left temporal bone.    I reviewed outside medical records.    Assessment and Plan  Martin Delgado is a 33 y.o. male with bilateral COM/cholesteatoma s/p right CWD mastoidectomy  on 04/2017. Patient doing well since last visit. There was debris in the right canal wall down cavity which was removed today. His left ear appears stable with TM retraction onto the stapes with partial erosion of the incus. We again discussed options including observation vs surgical intervention via tympanoplasty with ossicular chain reconstruction. He would like to continue with observation for now. Continue CASH powder as needed for otorrhea. Follow-up in 6 months with audiogram.    I appreciate the opportunity to participate in his care.      Scribed for Dr. Donnice CHRISTELLA Coleridge, MD, by Lynnie Bowie, medical scribe, on 12/08/2023 at 11:03 AM.    I, Dr. Donnice CHRISTELLA Coleridge, MD have personally reviewed and agree with the information entered by the scribe.

## 2023-12-02 NOTE — Unmapped (Signed)
 SURGERY CONSULT NOTE:   12/02/2023      Service Providing Consult: General Surgery  Consulting Attending: Vicenta Gee, MD      Impression:  Patient is a 33 y.o. male with umbilical hernia which is mildly symptomatic    Recommendations:   RTC 3 months .  Work to get BMI less than 40 prior to repair          History of Present Illness:   Reason for Consult:  Umbilical hernia    Martin Delgado is seen in consultation for abdominal pain around his belly button.  Over the last 6 weeks was doing activities that caused increasing pain in the area.  Work up with a CT scan showed an umbilical hernia.  No previous abdominal surgery.  No direct trauma to the area. He states the hernia bothers him occassionally.    Allergies:  Allergies[1]    Medications:  Current Rx[2]     Past Medical History:  Past Medical History[3]    Past Surgical History:  Past Surgical History[4]    Family History:  Family History[5]    Social History:  Short Social History[6]    Review of Systems  10 systems were reviewed and are negative except as noted specifically in the HPI.    Objective   BP 129/79 (BP Position: Sitting)  - Pulse 65  - Ht 180.3 cm (5' 11)  - Wt (!) 148.4 kg (327 lb 1.6 oz)  - SpO2 97%  - BMI 45.62 kg/m??     Physical Exam  General:  No acute distress, well appearing and well nourished.   Head:  Normocephalic, atraumatic.   Eyes:  Conjuctiva and lids appear normal. Pupils equal and round, sclera anicteric.   Ears:  Overall appearance normal with no scars, lesions or masses. Hearing is grossly normal.   Nose: Nares grossly normal, no drainage.   Throat: Lips, mucosa, and tongue normal; teeth and gums normal.   Neck: Supple, symmetrical, trachea midline   Pulmonary:    Normal respiratory effort.  Lungs were clear to auscultation bilaterally.   Cardiovascular:  Regular rate and rhythm, no murmur noted.    Abdomen:   Soft protuberant nontender.  Small 2 cm umbilical hernia defect that is reducible.   Musculoskeletal:  Extremities without clubbing, cyanosis, or edema.   Skin: Skin color, texture, turgor normal, no rashes or lesions.   Neurologic: No motor abnormalities noted.  Sensation grossly intact.   Psychiatric: Judgement and insight appropriate. Oriented to person, place, and time.     Most Recent Labs:  Lab Results   Component Value Date    WBC 5.4 04/23/2019    HGB 14.9 04/23/2019    HCT 44.3 04/23/2019    PLT 189 04/23/2019       Lab Results   Component Value Date    NA 143 03/23/2022    K 4.8 03/23/2022    CL 111 (H) 03/23/2022    CO2 24.0 03/23/2022    BUN 20 03/23/2022    CREATININE 0.86 03/23/2022    CALCIUM 9.0 03/23/2022       IMAGING:  CT Abdomen Pelvis W Contrast  Result Date: 11/01/2023  EXAM: CT ABDOMEN PELVIS W CONTRAST ACCESSION: [id number] UN REPORT DATE: 11/01/2023 1:38 PM CLINICAL INDICATION: 33 years old with Concern for abdominal (periumbilcal hernia)  - R10.33 - Periumbilical abdominal pain  COMPARISON: None TECHNIQUE: A helical CT scan of the abdomen and pelvis was obtained following IV contrast from the lung bases through  the pubic symphysis. Images were reconstructed in the axial plane. Coronal and sagittal reformatted images were also provided for further evaluation. FINDINGS: LOWER CHEST: Evaluation is limited due to motion degradation. Mild bibasilar atelectasis. LIVER: Normal liver contour.  No focal liver lesions. Diffuse hepatic hypoattenuation, compatible with steatosis. Fatty sparing noted along the gallbladder fossa. BILIARY: The gallbladder is normal in appearance. No biliary ductal dilatation.  SPLEEN: Normal in size and contour. PANCREAS: Normal pancreatic contour.  No focal lesions.  No ductal dilation. ADRENAL GLANDS: Normal appearance of the adrenal glands. KIDNEYS/URETERS: Symmetric renal enhancement.  No hydronephrosis.  No solid renal mass. BLADDER: Underdistended, limiting evaluation. There is linear soft tissue tracking from the umbilicus to the anterior superior bladder (5:75). REPRODUCTIVE ORGANS: Unremarkable. GI TRACT: No findings of bowel obstruction or acute inflammation. Normal appendix (4:62). Colonic diverticulosis. PERITONEUM, RETROPERITONEUM AND MESENTERY: No free air.  No ascites.  No fluid collection. LYMPH NODES: No adenopathy. VESSELS: Hepatic and portal veins are patent.  Normal caliber aorta.  BONES and SOFT TISSUES: No aggressive osseous lesions. Small fat-containing umbilical hernia (2:88, 5:76), with neck measuring 2.2 x 1.1 cm. Tiny fat-containing left inguinal hernia.     1. Small fat-containing umbilical hernia. 2. 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 3. Diffuse hepatic steatosis.       ASSESSMENT:  Umbilical hernia.  Discussed weight loss withthte patient.  He agreed with trying to wait for his BMI to get lower like below 40.  The patient will RTC in 3 months but contact us  sooner if the hernia really starts to bother him    PLAN: RTC in 3 months.  Continue efforts to loos weight.      Vicenta Gee, MD  General Surgery/Surgical Oncology          [1] No Known Allergies  [2]   Current Outpatient Medications   Medication Sig Dispense Refill    omeprazole  (PRILOSEC) 20 MG capsule TAKE 1 CAPSULE BY MOUTH EVERY DAY 60 capsule 1     No current facility-administered medications for this visit.   [3]   Past Medical History:  Diagnosis Date    Dizziness     GERD (gastroesophageal reflux disease)     Headache     ((Pt Qnr Sub: subsided-))     HL (hearing loss)     Nosebleed     Otitis media     Psoriasis     Tinnitus     ((Pt Qnr Sub: goes to ent every 6months))     TMJ dysfunction     ((Pt Qnr Sub: goes to ent every 6months))    [4]   Past Surgical History:  Procedure Laterality Date    PR EAR CARTILAGE GRAFT TO FACE Right 04/01/2017    Procedure: GRAFT; EAR CARTILAGE, AUTOGENOUS, TO NOSE OR EAR (INCLUDES OBTAINING GRAFT);  Surgeon: Yvonna Smith Breaker, MD;  Location: [facility] OR [facility];  Service: ENT    PR EXC TUM/VASC MAL SFT TISS HAND/FNGR SUBQ <1.5CM Right 01/27/2018    Procedure: EXCISION, TUMOR OR VASCULAR MALFORMATION, SOFT TISSUE OF HAND OR FINGER, SUBCUTANEOUS; LESS THAN 1.5 CM;  Surgeon: Robynn Tanda Beaver, MD;  Location: [facility] OR [facility];  Service: Ortho Hand    PR MICROSURG TECHNIQUES,REQ OPER MICROSCOPE N/A 04/01/2017    Procedure: MICROSURGICAL TECHNIQUES, REQUIRING USE OF OPERATING MICROSCOPE (LIST SEPARATELY IN ADDITION TO CODE FOR PRIMARY PROCEDURE);  Surgeon: Yvonna Smith Breaker, MD;  Location: ASC OR [facility];  Service: ENT    PR RECONSTRUC OF NAIL BED Right 01/27/2018    Procedure: REPAIR OF NAIL BED--RIGHT LONG FINGER;  Surgeon: Robynn Blush Draeger, MD;  Location: [facility];  Service: Ortho Hand    PR REMOVAL OF NAIL PLATE Right 0/72/7980    Procedure: AVULSION OF NAIL PLATE, PARTIAL OR COMPLETE, SIMPLE; SINGLE--RIGHT LONG FINGER;  Surgeon: Robynn Blush Beaver, MD;  Location: [facility] OR [facility];  Service: Ortho Hand    PR TYMPANOPLAS/MASTOIDEC,RADICAL/COMPLE Right 04/01/2017    Procedure: SHIRLIE DILLS; RADICAL/COMPLT;  Surgeon: Yvonna Smith Breaker, MD;  Location: [facility] OR [facility];  Service: ENT   [5]   Family History  Problem Relation Age of Onset    Diabetes Maternal Grandmother     Heart failure Maternal Grandmother     Hypertension Maternal Grandmother     Osteoarthritis Maternal Grandmother    [6]   Social History  Tobacco Use    Smoking status: Former     Current packs/day: 0.00     Types: Cigarettes     Quit date: 03/07/2016     Years since quitting: 7.7     Passive exposure: Past    Smokeless tobacco: Never   Vaping Use    Vaping status: Never Used   Substance Use Topics    Alcohol use: Yes     Comment: socially    Drug use: No

## 2023-12-08 DIAGNOSIS — H7191 Unspecified cholesteatoma, right ear: Principal | ICD-10-CM

## 2023-12-08 DIAGNOSIS — H908 Mixed conductive and sensorineural hearing loss, unspecified: Principal | ICD-10-CM

## 2023-12-08 DIAGNOSIS — H6122 Impacted cerumen, left ear: Principal | ICD-10-CM

## 2023-12-08 DIAGNOSIS — H6121 Impacted cerumen, right ear: Principal | ICD-10-CM

## 2023-12-27 DIAGNOSIS — G4739 Other sleep apnea: Principal | ICD-10-CM

## 2024-01-16 MED ORDER — ZEPBOUND 2.5 MG/0.5 ML SUBCUTANEOUS PEN INJECTOR
SUBCUTANEOUS | 0 refills | 28.00000 days | Status: CP
Start: 2024-01-16 — End: 2024-02-07

## 2024-01-16 NOTE — Unmapped (Signed)
 ASSESSMENT/PLAN:    We had a good discussion with the patient regarding the problems listed below. The detailed plan is outlined below. Return/Emergent precautions were discussed. All questions were answered and patient agrees with the treatment plan moving forward.     I had a good discussion with the patient.  Patient has obstructive sleep apnea and an umbilical hernia.  Both these conditions can be worsened by weight gain.  Patient has tried and has success with Zepbound .  Different treatment options were discussed to help to reduce weight in conjunction with diet and exercise.  With shared decision making, we elected proceed with restarting his Zepbound  medications.  This can help with his obstructive sleep apnea and lower his BMI so the patient can become a surgical candidate to help with the repair of his umbilical hernia.  Medication as detailed below.  Patient will follow-up in 6 to 8 weeks after starting Zepbound .    Diagnoses and all orders for this visit:    Morbid obesity with BMI of 40.0-44.9, adult (CMS-HCC)  -     tirzepatide  (ZEPBOUND ) 2.5 mg/0.5 mL injection pen; Inject 0.5 mL (2.5 mg total) under the skin every seven (7) days for 4 doses.  -     tirzepatide  (ZEPBOUND ) 5 mg/0.5 mL injection pen; Inject 5 mg under the skin every seven (7) days for 4 doses.  -     tirzepatide  (ZEPBOUND ) 7.5 mg/0.5 mL injection pen; Inject 7.5 mg under the skin every seven (7) days for 4 doses.    Unable to lose weight  -     tirzepatide  (ZEPBOUND ) 2.5 mg/0.5 mL injection pen; Inject 0.5 mL (2.5 mg total) under the skin every seven (7) days for 4 doses.  -     tirzepatide  (ZEPBOUND ) 5 mg/0.5 mL injection pen; Inject 5 mg under the skin every seven (7) days for 4 doses.  -     tirzepatide  (ZEPBOUND ) 7.5 mg/0.5 mL injection pen; Inject 7.5 mg under the skin every seven (7) days for 4 doses.    Umbilical hernia with obstruction, without gangrene  -     tirzepatide  (ZEPBOUND ) 2.5 mg/0.5 mL injection pen; Inject 0.5 mL (2.5 mg total) under the skin every seven (7) days for 4 doses.  -     tirzepatide  (ZEPBOUND ) 5 mg/0.5 mL injection pen; Inject 5 mg under the skin every seven (7) days for 4 doses.  -     tirzepatide  (ZEPBOUND ) 7.5 mg/0.5 mL injection pen; Inject 7.5 mg under the skin every seven (7) days for 4 doses.    Chronic abdominal pain  -     tirzepatide  (ZEPBOUND ) 2.5 mg/0.5 mL injection pen; Inject 0.5 mL (2.5 mg total) under the skin every seven (7) days for 4 doses.  -     tirzepatide  (ZEPBOUND ) 5 mg/0.5 mL injection pen; Inject 5 mg under the skin every seven (7) days for 4 doses.  -     tirzepatide  (ZEPBOUND ) 7.5 mg/0.5 mL injection pen; Inject 7.5 mg under the skin every seven (7) days for 4 doses.    Umbilical hernia without obstruction and without gangrene  -     tirzepatide  (ZEPBOUND ) 2.5 mg/0.5 mL injection pen; Inject 0.5 mL (2.5 mg total) under the skin every seven (7) days for 4 doses.  -     tirzepatide  (ZEPBOUND ) 5 mg/0.5 mL injection pen; Inject 5 mg under the skin every seven (7) days for 4 doses.  -     tirzepatide  (ZEPBOUND ) 7.5 mg/0.5  mL injection pen; Inject 7.5 mg under the skin every seven (7) days for 4 doses.    Other sleep apnea  -     tirzepatide  (ZEPBOUND ) 2.5 mg/0.5 mL injection pen; Inject 0.5 mL (2.5 mg total) under the skin every seven (7) days for 4 doses.  -     tirzepatide  (ZEPBOUND ) 5 mg/0.5 mL injection pen; Inject 5 mg under the skin every seven (7) days for 4 doses.  -     tirzepatide  (ZEPBOUND ) 7.5 mg/0.5 mL injection pen; Inject 7.5 mg under the skin every seven (7) days for 4 doses.    OSA on CPAP  -     tirzepatide  (ZEPBOUND ) 2.5 mg/0.5 mL injection pen; Inject 0.5 mL (2.5 mg total) under the skin every seven (7) days for 4 doses.  -     tirzepatide  (ZEPBOUND ) 5 mg/0.5 mL injection pen; Inject 5 mg under the skin every seven (7) days for 4 doses.  -     tirzepatide  (ZEPBOUND ) 7.5 mg/0.5 mL injection pen; Inject 7.5 mg under the skin every seven (7) days for 4 doses.    Flu vaccine need  -     INFLUENZA VACCINE IIV3(IM)(PF)6 MOS UP    Hepatic steatosis  -     tirzepatide  (ZEPBOUND ) 2.5 mg/0.5 mL injection pen; Inject 0.5 mL (2.5 mg total) under the skin every seven (7) days for 4 doses.  -     tirzepatide  (ZEPBOUND ) 5 mg/0.5 mL injection pen; Inject 5 mg under the skin every seven (7) days for 4 doses.  -     tirzepatide  (ZEPBOUND ) 7.5 mg/0.5 mL injection pen; Inject 7.5 mg under the skin every seven (7) days for 4 doses.        Martin Bennett, MD   [facility]  [facility]   [employer]  [place]   01/16/2024      Chief Complaint   Patient presents with    Continuity of Care        SUBJECTIVE:    [name] is a 33 y.o. male that presents to [facility]  today regarding the following issues:    Patient is presenting to discuss weight management and also abdominal pain.  Patient has a umbilical hernia.    It was noted on CT back on 11/01/2023.  Patient has pain with flexion next abdominal muscles, exercising, and direct pressure on the area.  The patient was seen by general surgery.  The general surgeon recommended surgical intervention.  However, the patient's BMI is above 40 and he does not make him an ideal surgical candidate.  The patient also has obstructive sleep apnea on a CPAP and weight management is key to help improvement of his symptoms.  Patient is presenting to discuss weight management plans to help with his obstructive sleep apnea and also helping the patient become a surgical candidate for repair of his umbilical hernia.  Patient is presenting for further evaluation    I have reviewed the patients problem list, medical history, surgical history, laboratory history and recent hospitalizations, current medications, allergies, and social history and updated them as needed.    Review of symptoms:  Negative unless  Otherwise stated in HPI.     OBJECTIVE:    VITALS:   Vitals:    01/16/24 1158   BP: 138/89   Pulse: 97 Temp: 36.6 ??C (97.9 ??F)    Wt:   Wt Readings from Last 3 Encounters:  01/16/24 (!) 147.7 kg (325 lb 9.6 oz)   12/02/23 (!) 148.4 kg (327 lb 1.6 oz)   10/25/23 (!) 144.7 kg (319 lb)       Physical Exam     Gen: Pleasant and cooperative in NAD, resting comfortably, appears stated age  Head: Normocephalic, atraumatic  EENT: PERRLA, EOMI, sclera cleary  CV: Normal S1 S2 no m/r/g  Resp: Clear to ascultation bilaterally without adventitious sounds  GI: Soft, nontender,+BS, Right Umbilical hernia noted.  Msk: No obvious deformities or swelling, strength 5/5 in BUE and BLE   Ext:  Pulses are palpable +2 in all four extremities, no swelling    I personally spent 30 minutes face-to-face and non-face-to-face in the care of this patient, which includes all pre, intra, and post visit time on the date of service.    Labs  @LABRESULTS @     Imaging  CT Abdomen Pelvis W Contrast  Narrative: EXAM: CT ABDOMEN PELVIS W CONTRAST  ACCESSION: [id number] UN  REPORT DATE: 11/01/2023 1:38 PM  CLINICAL INDICATION: 33 years old with Concern for abdominal (periumbilcal hernia)  - R10.33 - Periumbilical abdominal pain      COMPARISON: None    TECHNIQUE: A helical CT scan of the abdomen and pelvis was obtained following IV contrast from the lung bases through the pubic symphysis. Images were reconstructed in the axial plane. Coronal and sagittal reformatted images were also provided for further evaluation.     FINDINGS:    LOWER CHEST: Evaluation is limited due to motion degradation. Mild bibasilar atelectasis.    LIVER: Normal liver contour.  No focal liver lesions. Diffuse hepatic hypoattenuation, compatible with steatosis. Fatty sparing noted along the gallbladder fossa.    BILIARY: The gallbladder is normal in appearance. No biliary ductal dilatation.      SPLEEN: Normal in size and contour.     PANCREAS: Normal pancreatic contour.  No focal lesions.  No ductal dilation.    ADRENAL GLANDS: Normal appearance of the adrenal glands.    KIDNEYS/URETERS: Symmetric renal enhancement.  No hydronephrosis.  No solid renal mass.    BLADDER: Underdistended, limiting evaluation. There is linear soft tissue tracking from the umbilicus to the anterior superior bladder (5:75).    REPRODUCTIVE ORGANS: Unremarkable.    GI TRACT: No findings of bowel obstruction or acute inflammation. Normal appendix (4:62). Colonic diverticulosis.    PERITONEUM, RETROPERITONEUM AND MESENTERY: No free air.  No ascites.  No fluid collection.    LYMPH NODES: No adenopathy.    VESSELS: Hepatic and portal veins are patent.  Normal caliber aorta.      BONES and SOFT TISSUES: No aggressive osseous lesions. Small fat-containing umbilical hernia (2:88, 5:76), with neck measuring 2.2 x 1.1 cm. Tiny fat-containing left inguinal hernia.  Impression: 1. Small fat-containing umbilical hernia.  2. 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  3. Diffuse hepatic steatosis.          Martin Bennett, MD, AMYE  [facility]  [facility]   [employer]  [place]   01/16/2024

## 2024-01-24 NOTE — Unmapped (Signed)
 Prior shara was started 01/16/2024 and [employer] was called today and they informed me that they have until 01/27/2024 to make the decision.

## 2024-02-03 NOTE — Unmapped (Signed)
 Pharmacy said the patient picked up the zepbound  on 02/01/2024

## 2024-02-13 MED ORDER — ZEPBOUND 5 MG/0.5 ML SUBCUTANEOUS PEN INJECTOR
SUBCUTANEOUS | 0 refills | 28.00000 days | Status: CP
Start: 2024-02-13 — End: 2024-03-06

## 2024-02-26 DIAGNOSIS — R638 Other symptoms and signs concerning food and fluid intake: Principal | ICD-10-CM

## 2024-02-26 DIAGNOSIS — K420 Umbilical hernia with obstruction, without gangrene: Principal | ICD-10-CM

## 2024-02-26 DIAGNOSIS — R109 Unspecified abdominal pain: Principal | ICD-10-CM

## 2024-02-26 DIAGNOSIS — G4733 Obstructive sleep apnea (adult) (pediatric): Principal | ICD-10-CM

## 2024-02-26 DIAGNOSIS — Z6841 Body Mass Index (BMI) 40.0 and over, adult: Principal | ICD-10-CM

## 2024-02-26 DIAGNOSIS — K429 Umbilical hernia without obstruction or gangrene: Principal | ICD-10-CM

## 2024-02-26 DIAGNOSIS — G8929 Other chronic pain: Principal | ICD-10-CM

## 2024-02-26 DIAGNOSIS — K760 Fatty (change of) liver, not elsewhere classified: Principal | ICD-10-CM

## 2024-02-26 DIAGNOSIS — G4739 Other sleep apnea: Principal | ICD-10-CM

## 2024-02-26 MED ORDER — ZEPBOUND 2.5 MG/0.5 ML SUBCUTANEOUS PEN INJECTOR
SUBCUTANEOUS | 0.00000 days
Start: 2024-02-26 — End: ?

## 2024-02-27 DIAGNOSIS — L918 Other hypertrophic disorders of the skin: Principal | ICD-10-CM

## 2024-02-27 DIAGNOSIS — R21 Rash and other nonspecific skin eruption: Principal | ICD-10-CM

## 2024-02-27 DIAGNOSIS — L83 Acanthosis nigricans: Principal | ICD-10-CM

## 2024-02-27 NOTE — Progress Notes (Signed)
 Dermatology Note     Assessment and Plan:      Acanthosis nigricans affecting the groin, Chronic: flared or not at treatment goal  - Diagnosis, etiology, and prognosis were discussed with the patient.  - Explained that this is related to insulin resistance and weight, and should improve with patient's tirzepatide     Rash affecting chest, lower back, and buttocks, chronic  - Etiology is unclear and patient is asymptomatic  - Offered patient options including biopsy and empiric antifungal treatment, patient declined  - Patient instructed to return if rash worsens or changes    Benign Lesions/ Findings:   Acrochordon(s), inflamed  - Reassurance provided regarding the benign appearance of lesions noted on exam today. Offered treatment iso symptoms.  Snip removal of acrochordon:  Time out was performed and the patient's identity was confirmed by name and solicited date of birth prior to the procedure. Informed consent was obtained from the patient, including but not limited to discussion of the risks of pain, bleeding, infection, recurrence, further treatment, and scarring. The site was cleaned with an alcohol swab. Standard scissors excision was performed and hemostasis was achieved with topical aluminum chloride as needed.  The specimen was not submitted for histopathologic review. The wound was dressed with petrolatum and a bandage.  No complications were encountered.  The patient was instructed in wound care.    Education was provided by discussing the etiology, natural history, course and treatment for the above conditions. Reassurance and anticipatory guidance were provided.     The patient was advised to call for an appointment should any new, changing, or symptomatic lesions develop.     RTC: Return if symptoms worsen or fail to improve. or sooner as needed   _________________________________________________________________      Chief Complaint     Chief Complaint   Patient presents with    Rash     Chest, Buttock x yrs goes away with topical cream and tags on back and neck one on back caught in clothing.       HPI     Martin Delgado is a 33 y.o. male who presents as a new patient (not seen in the last 3 years) to Dermatology for two rashes.    Patient has dark rash in the groin.    Patient has a brown to red rash on the chest, lower back, and buttock for years. It is asymptomatic. It improves with antifungal cream.    Pertinent Past Medical History     Problem List    None    Past Medical History, Family History, Social History, Medication List, Allergies, and Problem List were reviewed in the rooming section of Epic.     ROS: Other than symptoms mentioned in the HPI, no fevers, chill, or other skin complaints.     Physical Examination     GENERAL: Well-appearing male in no acute distress, resting comfortably.  NEURO: Alert and oriented, answers questions appropriately  PSYCH: Normal mood and affect  RESP: No increased work of breathing  SKIN (Focal Skin Exam): Per patient request, examination of torso, upper buttocks, and groin was performed    - Areas of ill-defined erythema on L chest, lower back, and upper buttocks  - Groin with thickening and hyperpigmentation c/w acanthosis nigricans      All areas not commented on are within normal limits or unremarkable      (Approved Template 01/14/2020)

## 2024-02-28 MED ORDER — ZEPBOUND 2.5 MG/0.5 ML SUBCUTANEOUS PEN INJECTOR
SUBCUTANEOUS | 0 refills | 28.00000 days | Status: CP
Start: 2024-02-28 — End: 2024-03-21

## 2024-03-12 MED ORDER — ZEPBOUND 7.5 MG/0.5 ML SUBCUTANEOUS PEN INJECTOR
SUBCUTANEOUS | 0 refills | 28.00000 days | Status: CP
Start: 2024-03-12 — End: 2024-04-03

## 2024-05-01 DIAGNOSIS — G4739 Other sleep apnea: Principal | ICD-10-CM

## 2024-05-01 DIAGNOSIS — K429 Umbilical hernia without obstruction or gangrene: Principal | ICD-10-CM

## 2024-05-01 DIAGNOSIS — K420 Umbilical hernia with obstruction, without gangrene: Principal | ICD-10-CM

## 2024-05-01 DIAGNOSIS — R638 Other symptoms and signs concerning food and fluid intake: Principal | ICD-10-CM

## 2024-05-01 DIAGNOSIS — Z6841 Body Mass Index (BMI) 40.0 and over, adult: Principal | ICD-10-CM

## 2024-05-01 DIAGNOSIS — K760 Fatty (change of) liver, not elsewhere classified: Principal | ICD-10-CM

## 2024-05-01 DIAGNOSIS — R109 Unspecified abdominal pain: Principal | ICD-10-CM

## 2024-05-01 DIAGNOSIS — G8929 Other chronic pain: Principal | ICD-10-CM

## 2024-05-01 DIAGNOSIS — G4733 Obstructive sleep apnea (adult) (pediatric): Principal | ICD-10-CM

## 2024-05-01 MED ORDER — ZEPBOUND 5 MG/0.5 ML SUBCUTANEOUS PEN INJECTOR
SUBCUTANEOUS | 0.00000 days
Start: 2024-05-01 — End: ?

## 2024-05-02 DIAGNOSIS — Z6841 Body Mass Index (BMI) 40.0 and over, adult: Principal | ICD-10-CM

## 2024-05-02 DIAGNOSIS — R638 Other symptoms and signs concerning food and fluid intake: Principal | ICD-10-CM

## 2024-05-02 DIAGNOSIS — G4739 Other sleep apnea: Principal | ICD-10-CM

## 2024-05-02 MED ORDER — ZEPBOUND 5 MG/0.5 ML SUBCUTANEOUS PEN INJECTOR
SUBCUTANEOUS | 0 refills | 28.00000 days | Status: CP
Start: 2024-05-02 — End: ?

## 2024-05-04 DIAGNOSIS — Z6841 Body Mass Index (BMI) 40.0 and over, adult: Principal | ICD-10-CM

## 2024-05-04 DIAGNOSIS — R638 Other symptoms and signs concerning food and fluid intake: Principal | ICD-10-CM

## 2024-05-04 DIAGNOSIS — G4739 Other sleep apnea: Principal | ICD-10-CM

## 2024-05-04 MED ORDER — ZEPBOUND 5 MG/0.5 ML SUBCUTANEOUS PEN INJECTOR
SUBCUTANEOUS | 5 refills | 28.00000 days | Status: CP
Start: 2024-05-04 — End: ?

## 2024-05-05 MED ORDER — ZEPBOUND 5 MG/0.5 ML SUBCUTANEOUS PEN INJECTOR
SUBCUTANEOUS | 0.00000 days
Start: 2024-05-05 — End: 2024-05-27

## 2024-05-05 NOTE — Telephone Encounter (Signed)
 Requesting refill for Zepbound  5 MG  Last seen 01/16/2024  Next Appointment No follow up scheduled  Last Refill Date 05/04/2024 for 2 mL with 5 refills      Refill already done yesterday
